# Patient Record
Sex: Male | Born: 1975 | Race: Black or African American | Hispanic: No | Marital: Single | Smoking: Former smoker
Health system: Southern US, Community
[De-identification: ages and names within clinical notes are randomized; demographics above are authoritative.]

## PROBLEM LIST (undated history)

## (undated) ENCOUNTER — Ambulatory Visit (HOSPITAL_COMMUNITY): Admission: EM | Payer: Medicaid Other | Source: Home / Self Care

## (undated) DIAGNOSIS — K602 Anal fissure, unspecified: Secondary | ICD-10-CM

## (undated) DIAGNOSIS — K6289 Other specified diseases of anus and rectum: Secondary | ICD-10-CM

## (undated) DIAGNOSIS — S025XXA Fracture of tooth (traumatic), initial encounter for closed fracture: Secondary | ICD-10-CM

## (undated) DIAGNOSIS — K611 Rectal abscess: Secondary | ICD-10-CM

## (undated) DIAGNOSIS — R21 Rash and other nonspecific skin eruption: Secondary | ICD-10-CM

## (undated) DIAGNOSIS — K625 Hemorrhage of anus and rectum: Secondary | ICD-10-CM

## (undated) HISTORY — DX: Rash and other nonspecific skin eruption: R21

## (undated) HISTORY — DX: Hemorrhage of anus and rectum: K62.5

## (undated) HISTORY — DX: Anal fissure, unspecified: K60.2

## (undated) HISTORY — DX: Other specified diseases of anus and rectum: K62.89

## (undated) HISTORY — DX: Rectal abscess: K61.1

## (undated) HISTORY — DX: Fracture of tooth (traumatic), initial encounter for closed fracture: S02.5XXA

---

## 1998-02-27 ENCOUNTER — Emergency Department (HOSPITAL_COMMUNITY): Admission: EM | Admit: 1998-02-27 | Discharge: 1998-02-27 | Payer: Self-pay | Admitting: Emergency Medicine

## 1998-02-27 ENCOUNTER — Encounter: Payer: Self-pay | Admitting: Emergency Medicine

## 2001-09-30 ENCOUNTER — Emergency Department (HOSPITAL_COMMUNITY): Admission: EM | Admit: 2001-09-30 | Discharge: 2001-09-30 | Payer: Self-pay | Admitting: Emergency Medicine

## 2005-06-26 ENCOUNTER — Ambulatory Visit: Payer: Self-pay | Admitting: Internal Medicine

## 2005-06-30 ENCOUNTER — Ambulatory Visit: Payer: Self-pay | Admitting: Internal Medicine

## 2005-07-01 ENCOUNTER — Ambulatory Visit: Payer: Self-pay | Admitting: *Deleted

## 2006-09-03 ENCOUNTER — Emergency Department (HOSPITAL_COMMUNITY): Admission: EM | Admit: 2006-09-03 | Discharge: 2006-09-03 | Payer: Self-pay | Admitting: Emergency Medicine

## 2007-09-18 ENCOUNTER — Emergency Department (HOSPITAL_COMMUNITY): Admission: EM | Admit: 2007-09-18 | Discharge: 2007-09-18 | Payer: Self-pay | Admitting: Emergency Medicine

## 2007-09-26 ENCOUNTER — Emergency Department (HOSPITAL_COMMUNITY): Admission: EM | Admit: 2007-09-26 | Discharge: 2007-09-26 | Payer: Self-pay | Admitting: Emergency Medicine

## 2007-10-01 ENCOUNTER — Emergency Department (HOSPITAL_COMMUNITY): Admission: EM | Admit: 2007-10-01 | Discharge: 2007-10-01 | Payer: Self-pay | Admitting: Emergency Medicine

## 2008-05-30 ENCOUNTER — Telehealth: Payer: Self-pay | Admitting: Internal Medicine

## 2008-06-01 ENCOUNTER — Ambulatory Visit: Payer: Self-pay | Admitting: Internal Medicine

## 2008-06-01 ENCOUNTER — Encounter: Payer: Self-pay | Admitting: Internal Medicine

## 2008-06-01 DIAGNOSIS — B20 Human immunodeficiency virus [HIV] disease: Secondary | ICD-10-CM

## 2008-06-01 DIAGNOSIS — A539 Syphilis, unspecified: Secondary | ICD-10-CM

## 2008-08-01 ENCOUNTER — Encounter: Payer: Self-pay | Admitting: Internal Medicine

## 2008-08-02 ENCOUNTER — Encounter: Payer: Self-pay | Admitting: Internal Medicine

## 2009-01-04 ENCOUNTER — Encounter: Payer: Self-pay | Admitting: Internal Medicine

## 2009-03-22 ENCOUNTER — Encounter: Payer: Self-pay | Admitting: Internal Medicine

## 2009-03-22 ENCOUNTER — Ambulatory Visit: Payer: Self-pay | Admitting: Internal Medicine

## 2009-03-22 LAB — CONVERTED CEMR LAB
ALT: 20 units/L (ref 0–53)
AST: 28 units/L (ref 0–37)
Absolute CD4: 138 #/uL — ABNORMAL LOW (ref 381–1469)
Albumin: 4.4 g/dL (ref 3.5–5.2)
Alkaline Phosphatase: 88 units/L (ref 39–117)
BUN: 11 mg/dL (ref 6–23)
Basophils Absolute: 0 10*3/uL (ref 0.0–0.1)
Basophils Relative: 0 % (ref 0–1)
CD4 T Helper %: 17 % — ABNORMAL LOW (ref 32–62)
CO2: 23 meq/L (ref 19–32)
Calcium: 8.9 mg/dL (ref 8.4–10.5)
Chlamydia, Swab/Urine, PCR: NEGATIVE
Chloride: 103 meq/L (ref 96–112)
Creatinine, Ser: 1.02 mg/dL (ref 0.40–1.50)
Eosinophils Absolute: 0.3 10*3/uL (ref 0.0–0.7)
Eosinophils Relative: 11 % — ABNORMAL HIGH (ref 0–5)
GC Probe Amp, Urine: NEGATIVE
Glucose, Bld: 76 mg/dL (ref 70–99)
HCT: 39.6 % (ref 39.0–52.0)
HCV Ab: NEGATIVE
HIV 1 RNA Quant: 370000 copies/mL — ABNORMAL HIGH (ref <48)
HIV-1 RNA Quant, Log: 5.57 — ABNORMAL HIGH (ref <1.68)
Hemoglobin: 12.4 g/dL — ABNORMAL LOW (ref 13.0–17.0)
Hep B S Ab: NEGATIVE
Hepatitis B Surface Ag: NEGATIVE
Lymphocytes Relative: 30 % (ref 12–46)
Lymphs Abs: 0.8 10*3/uL (ref 0.7–4.0)
MCHC: 31.3 g/dL (ref 30.0–36.0)
MCV: 87 fL (ref 78.0–100.0)
Monocytes Absolute: 0.4 10*3/uL (ref 0.1–1.0)
Monocytes Relative: 14 % — ABNORMAL HIGH (ref 3–12)
Neutro Abs: 1.2 10*3/uL — ABNORMAL LOW (ref 1.7–7.7)
Neutrophils Relative %: 45 % (ref 43–77)
Platelets: 204 10*3/uL (ref 150–400)
Potassium: 3.6 meq/L (ref 3.5–5.3)
RBC: 4.55 M/uL (ref 4.22–5.81)
RDW: 13.4 % (ref 11.5–15.5)
RPR Ser Ql: REACTIVE — AB
RPR Titer: 1:4 {titer}
Sodium: 139 meq/L (ref 135–145)
T pallidum Antibodies (TP-PA): 32.9 — ABNORMAL HIGH (ref <1.0)
Total Bilirubin: 0.3 mg/dL (ref 0.3–1.2)
Total Protein: 8.4 g/dL — ABNORMAL HIGH (ref 6.0–8.3)
Total lymphocyte count: 810 cells/mcL (ref 700–3300)
WBC: 2.7 10*3/uL — ABNORMAL LOW (ref 4.0–10.5)

## 2009-03-30 ENCOUNTER — Telehealth: Payer: Self-pay | Admitting: Internal Medicine

## 2009-04-12 ENCOUNTER — Encounter: Payer: Self-pay | Admitting: Internal Medicine

## 2009-04-12 ENCOUNTER — Ambulatory Visit: Payer: Self-pay | Admitting: Internal Medicine

## 2009-04-12 LAB — CONVERTED CEMR LAB
HIV 1 RNA Quant: 485000 copies/mL — ABNORMAL HIGH (ref <48)
HIV-1 RNA Quant, Log: 5.69 — ABNORMAL HIGH (ref <1.68)

## 2009-04-21 ENCOUNTER — Emergency Department (HOSPITAL_COMMUNITY): Admission: EM | Admit: 2009-04-21 | Discharge: 2009-04-21 | Payer: Self-pay | Admitting: Emergency Medicine

## 2009-05-10 ENCOUNTER — Encounter: Payer: Self-pay | Admitting: Internal Medicine

## 2009-05-15 ENCOUNTER — Ambulatory Visit: Payer: Self-pay | Admitting: Internal Medicine

## 2009-05-15 LAB — CONVERTED CEMR LAB
ALT: 18 units/L (ref 0–53)
AST: 24 units/L (ref 0–37)
Albumin: 4 g/dL (ref 3.5–5.2)
Alkaline Phosphatase: 93 units/L (ref 39–117)
BUN: 16 mg/dL (ref 6–23)
CO2: 25 meq/L (ref 19–32)
Calcium: 9.1 mg/dL (ref 8.4–10.5)
Chloride: 101 meq/L (ref 96–112)
Creatinine, Ser: 1.14 mg/dL (ref 0.40–1.50)
Glucose, Bld: 94 mg/dL (ref 70–99)
HCT: 39 % (ref 39.0–52.0)
HIV 1 RNA Quant: 421000 copies/mL — ABNORMAL HIGH (ref <48)
HIV-1 RNA Quant, Log: 5.62 — ABNORMAL HIGH (ref <1.68)
Hemoglobin: 12.2 g/dL — ABNORMAL LOW (ref 13.0–17.0)
MCHC: 31.3 g/dL (ref 30.0–36.0)
MCV: 86.7 fL (ref 78.0–100.0)
Platelets: 219 10*3/uL (ref 150–400)
Potassium: 3.9 meq/L (ref 3.5–5.3)
RBC: 4.5 M/uL (ref 4.22–5.81)
RDW: 13.4 % (ref 11.5–15.5)
Sodium: 137 meq/L (ref 135–145)
Total Bilirubin: 0.3 mg/dL (ref 0.3–1.2)
Total Protein: 8.5 g/dL — ABNORMAL HIGH (ref 6.0–8.3)
WBC: 3.5 10*3/uL — ABNORMAL LOW (ref 4.0–10.5)

## 2009-05-25 ENCOUNTER — Ambulatory Visit: Payer: Self-pay | Admitting: Internal Medicine

## 2009-05-25 DIAGNOSIS — K648 Other hemorrhoids: Secondary | ICD-10-CM | POA: Insufficient documentation

## 2009-05-30 ENCOUNTER — Encounter (INDEPENDENT_AMBULATORY_CARE_PROVIDER_SITE_OTHER): Payer: Self-pay | Admitting: *Deleted

## 2009-06-01 ENCOUNTER — Telehealth: Payer: Self-pay | Admitting: Internal Medicine

## 2009-07-04 ENCOUNTER — Telehealth: Payer: Self-pay | Admitting: Internal Medicine

## 2009-08-13 ENCOUNTER — Telehealth: Payer: Self-pay | Admitting: Internal Medicine

## 2009-08-15 ENCOUNTER — Encounter: Payer: Self-pay | Admitting: Internal Medicine

## 2009-09-06 ENCOUNTER — Telehealth (INDEPENDENT_AMBULATORY_CARE_PROVIDER_SITE_OTHER): Payer: Self-pay | Admitting: *Deleted

## 2009-10-09 ENCOUNTER — Telehealth: Payer: Self-pay | Admitting: Internal Medicine

## 2009-10-18 ENCOUNTER — Ambulatory Visit (HOSPITAL_COMMUNITY): Admission: EM | Admit: 2009-10-18 | Discharge: 2009-10-19 | Payer: Self-pay | Admitting: Emergency Medicine

## 2009-10-30 ENCOUNTER — Ambulatory Visit: Payer: Self-pay | Admitting: Internal Medicine

## 2009-10-30 LAB — CONVERTED CEMR LAB
HIV 1 RNA Quant: <20 copies/mL (ref <20)
HIV-1 RNA Quant, Log: <1.3 (ref <1.30)

## 2009-11-01 LAB — CONVERTED CEMR LAB
ALT: 19 units/L (ref 0–53)
AST: 20 units/L (ref 0–37)
Albumin: 4.2 g/dL (ref 3.5–5.2)
Alkaline Phosphatase: 79 units/L (ref 39–117)
BUN: 13 mg/dL (ref 6–23)
Basophils Absolute: 0 10*3/uL (ref 0.0–0.1)
Basophils Relative: 0 % (ref 0–1)
CO2: 26 meq/L (ref 19–32)
Calcium: 9.3 mg/dL (ref 8.4–10.5)
Chloride: 102 meq/L (ref 96–112)
Creatinine, Ser: 1.28 mg/dL (ref 0.40–1.50)
Eosinophils Absolute: 0.3 10*3/uL (ref 0.0–0.7)
Eosinophils Relative: 8 % — ABNORMAL HIGH (ref 0–5)
Glucose, Bld: 87 mg/dL (ref 70–99)
HCT: 39.2 % (ref 39.0–52.0)
Hemoglobin: 12.4 g/dL — ABNORMAL LOW (ref 13.0–17.0)
Lymphocytes Relative: 31 % (ref 12–46)
Lymphs Abs: 1.1 10*3/uL (ref 0.7–4.0)
MCHC: 31.6 g/dL (ref 30.0–36.0)
MCV: 87.5 fL (ref 78.0–100.0)
Monocytes Absolute: 0.5 10*3/uL (ref 0.1–1.0)
Monocytes Relative: 14 % — ABNORMAL HIGH (ref 3–12)
Neutro Abs: 1.7 10*3/uL (ref 1.7–7.7)
Neutrophils Relative %: 48 % (ref 43–77)
Platelets: 326 10*3/uL (ref 150–400)
Potassium: 4 meq/L (ref 3.5–5.3)
RBC: 4.48 M/uL (ref 4.22–5.81)
RDW: 13.3 % (ref 11.5–15.5)
RPR Ser Ql: REACTIVE — AB
RPR Titer: 1:1 {titer}
Sodium: 137 meq/L (ref 135–145)
T pallidum Antibodies (TP-PA): >8 — ABNORMAL HIGH (ref <0.90)
Total Bilirubin: 0.3 mg/dL (ref 0.3–1.2)
Total Protein: 7.7 g/dL (ref 6.0–8.3)
WBC: 3.6 10*3/uL — ABNORMAL LOW (ref 4.0–10.5)

## 2009-12-18 ENCOUNTER — Encounter (INDEPENDENT_AMBULATORY_CARE_PROVIDER_SITE_OTHER): Payer: Self-pay | Admitting: *Deleted

## 2010-02-19 NOTE — Miscellaneous (Signed)
Summary: RW Update  Clinical Lists Changes  Observations: Added new observation of DATE1STVISIT: 05/25/2009 (08/15/2009 12:00)

## 2010-02-19 NOTE — Progress Notes (Signed)
Summary: release of information  Phone Note Outgoing Call   Call placed by: Sharen Heck RN,  March 30, 2009 11:22 AM Call placed to: Patient Summary of Call: ML ON VM to call. Initial call taken by: Sharen Heck RN,  March 30, 2009 11:22 AM    04/03/09 Pt. returned call and advised to sign release of information so records could be obtained per Dr. Philipp Deputy request. Sharen Heck RN

## 2010-02-19 NOTE — Miscellaneous (Signed)
Summary: HIPAA Restrictions  HIPAA Restrictions   Imported By: Florinda Marker 05/15/2009 16:01:50  _____________________________________________________________________  External Attachment:    Type:   Image     Comment:   External Document

## 2010-02-19 NOTE — Progress Notes (Signed)
Summary: [facility]/pt assist meds arrived for May  Phone Note Refill Request      Prescriptions: ATRIPLA 600-200-300 MG TABS (EFAVIRENZ-EMTRICITAB-TENOFOVIR) .hstab  #30 x 0   Entered by:   Paulo Fruit  BS,CPht II,MPH   Authorized by:   Yisroel Ramming MD   Signed by:   Paulo Fruit  BS,CPht II,MPH on 06/01/2009   Method used:   Samples Given   RxID:   [id number] BACTRIM DS 800-160 MG TABS (SULFAMETHOXAZOLE-TRIMETHOPRIM) Take 1 tablet by mouth once a day  #30 x 0   Entered by:   Paulo Fruit  BS,CPht II,MPH   Authorized by:   Yisroel Ramming MD   Signed by:   Paulo Fruit  BS,CPht II,MPH on 06/01/2009   Method used:   Samples Given   RxID:   [id number]  Patient Assist Medication Verification: Medication name: SMZ-TMP DS 800/160mg  RX #  [id number] Tech approval:MLD   Patient Assist Medication Verification: Medication:Atripla Lot# [id number] Exp Date:10 2013 Tech approval:MLD Call placed to patient with message that assistance medications are ready for pick-up. Left message for patient to contact [facility] @ [phone] Paulo Fruit  BS,CPht II,MPH  Jun 01, 2009 10:49 AM

## 2010-02-19 NOTE — Assessment & Plan Note (Signed)
Summary: 042 f/u /tkk   CC:  follow-up visit, lab results, and blood after bowel movements.  History of Present Illness: Christian Reid here to get started on HIV meds. He c/o of some blood when he wipes after a bowel movement.  No pain.  He does strain to to go to the bathroom.  Preventive Screening-Counseling & Management  Alcohol-Tobacco     Alcohol drinks/day: occasional     Smoking Status: quit < 6 months  Caffeine-Diet-Exercise     Caffeine use/day: 1     Does Patient Exercise: no  Safety-Violence-Falls     Seat Belt Use: yes      Sexual History:  n/a.    Comments: Christian Reid given condoms   Updated Prior Medication List: BACTRIM DS 800-160 MG TABS (SULFAMETHOXAZOLE-TRIMETHOPRIM) Take 1 tablet by mouth once a day ATRIPLA 600-200-300 MG TABS (EFAVIRENZ-EMTRICITAB-TENOFOVIR) .hstab ANUSOL-HC 25 MG SUPP (HYDROCORTISONE ACETATE) one suppository per rectum once daily  Current Allergies (reviewed today): No known allergies  Social History: Sexual History:  n/a  Review of Systems  The patient denies anorexia, fever, weight loss, abdominal pain, and melena.    Vital Signs:  Patient profile:   35 year old male Height:      72 inches (182.88 cm) Weight:      205.8 pounds (93.55 kg) BMI:     28.01 Temp:     98.3 degrees F (36.83 degrees C) oral Pulse rate:   85 / minute BP sitting:   135 / 89  (left arm)  Vitals Entered By: Wendall Mola CMA Duncan Dull) (May 25, 2009 3:26 PM) CC: follow-up visit, lab results, blood after bowel movements Is Patient Diabetic? No Pain Assessment Patient in pain? no      Nutritional Status BMI of 25 - 29 = overweight Nutritional Status Detail appetite "good"  Have you ever been in a relationship where you felt threatened, hurt or afraid?No   Does patient need assistance? Functional Status Self care Ambulation Normal   Physical Exam  General:  alert, well-developed, well-nourished, and well-hydrated.   Head:  normocephalic and atraumatic.    Mouth:  pharynx pink and moist.  no thrush  Lungs:  normal breath sounds.      Impression & Recommendations:  Problem # 1:  HIV INFECTION (ICD-042) Discussed treatment options.  Genotype shows no resistance. Will start Atripla.  Potential side effects discussed. Christian Reid will f/u in 6 weeks for repat labs. The following medications were removed from the medication list:    Bactrim Ds 800-160 Mg Tabs (Sulfamethoxazole-trimethoprim) .Marland Kitchen... Take 1 tablet by mouth once a day His updated medication list for this problem includes:    Bactrim Ds 800-160 Mg Tabs (Sulfamethoxazole-trimethoprim) .Marland Kitchen... Take 1 tablet by mouth once a day  Diagnostics Reviewed:  HIV: HIV positive - not AIDS (06/01/2008)   CD4: 120 (05/16/2009)   WBC: 3.5 (05/15/2009)   Hgb: 12.2 (05/15/2009)   HCT: 39.0 (05/15/2009)   Platelets: 219 (05/15/2009) HIV genotype: See Comment (04/12/2009)   HIV-1 RNA: 421000 (05/15/2009)   HBSAg: NEG (03/22/2009)  Orders: Est. Patient Level III (99213)Future Orders: T-CD4SP ([facility]) (CD4SP) ... 07/06/2009 T-HIV Viral Load [id number]) ... 07/06/2009 T-Comprehensive Metabolic Panel [id number]) ... 07/06/2009 T-CBC w/Diff ([id number]) ... 07/06/2009 T-RPR (Syphilis) [phone]) ... 07/06/2009  Problem # 2:  INTERNAL HEMORRHOIDS WITHOUT MENTION COMP (ICD-455.0) increase fluids and fiber in diet anusol hc supp  Medications Added to Medication List This Visit: 1)  Bactrim Ds 800-160 Mg Tabs (Sulfamethoxazole-trimethoprim) .... Take 1 tablet by  mouth once a day 2)  Atripla 600-200-300 Mg Tabs (Efavirenz-emtricitab-tenofovir) .... Marland Kitchenhstab 3)  Anusol-hc 25 Mg Supp (Hydrocortisone acetate) .... One suppository per rectum once daily  Patient Instructions: 1)  Please schedule a follow-up appointment in 8 weeks, 2 weeks after labs.  Prescriptions: ANUSOL-HC 25 MG SUPP (HYDROCORTISONE ACETATE) one suppository per rectum once daily  #10 x 0   Entered and Authorized by:   Yisroel Ramming MD    Signed by:   Yisroel Ramming MD on 05/25/2009   Method used:   Print then Give to Patient   RxID:   [id number] ATRIPLA 600-200-300 MG TABS (EFAVIRENZ-EMTRICITAB-TENOFOVIR) .hstab  #30 x 5   Entered and Authorized by:   Yisroel Ramming MD   Signed by:   Yisroel Ramming MD on 05/25/2009   Method used:   Print then Give to Patient   RxID:   [id number] BACTRIM DS 800-160 MG TABS (SULFAMETHOXAZOLE-TRIMETHOPRIM) Take 1 tablet by mouth once a day  #30 x 5   Entered and Authorized by:   Yisroel Ramming MD   Signed by:   Yisroel Ramming MD on 05/25/2009   Method used:   Print then Give to Patient   RxID:   [id number]

## 2010-02-19 NOTE — Progress Notes (Signed)
Summary: ncadap meds arrived for Sept--left msg for pt to call offic  Phone Note Refill Request      Prescriptions: BACTRIM DS 800-160 MG TABS (SULFAMETHOXAZOLE-TRIMETHOPRIM) Take 1 tablet by mouth once a day  #30 x 0   Entered by:   Paulo Fruit  BS,CPht II,MPH   Authorized by:   Yisroel Ramming MD   Signed by:   Paulo Fruit  BS,CPht II,MPH on 10/09/2009   Method used:   Samples Given   RxID:   [id number] ATRIPLA 600-200-300 MG TABS (EFAVIRENZ-EMTRICITAB-TENOFOVIR) .hstab  #30 x 0   Entered by:   Paulo Fruit  BS,CPht II,MPH   Authorized by:   Yisroel Ramming MD   Signed by:   Paulo Fruit  BS,CPht II,MPH on 10/09/2009   Method used:   Samples Given   RxID:   [id number]  Patient Assist Medication Verification: Medication name: Sulfameth/trimthroprim 800/160mg  RX # [id number] Tech approval:MLD  Patient Assist Medication Verification: Medication name:Atripla RX # [id number] Tech approval:MLD Call placed to patient with message that assistance medications are ready for pick-up. left message for patient to call Evalee Mutton Dupree  BS,CPht II,MPH  October 09, 2009 12:21 PM

## 2010-02-19 NOTE — Miscellaneous (Signed)
Summary: clinical update/ryan white   Clinical Lists Changes  Observations: Added new observation of RWTITLE: B (05/30/2009 15:44) Added new observation of PAYOR: No Insurance (05/30/2009 15:44) Added new observation of AIDSDAP: Yes 2011 (05/30/2009 15:44) Added new observation of INCOMESOURCE: None (05/30/2009 15:44) Added new observation of HOUSEINCOME: 0  (05/30/2009 15:44) Added new observation of #CHILD<18 IN: No  (05/30/2009 15:44) Added new observation of FAMILYSIZE: 1  (05/30/2009 15:44) Added new observation of HOUSING: Stable/permanent  (05/30/2009 15:44) Added new observation of FINASSESSDT: 05/30/2009  (05/30/2009 15:44) Added new observation of MARITAL STAT: Single  (05/30/2009 15:44) Added new observation of RACE: African American  (05/30/2009 15:44) Added new observation of REC_MESSAGE: Yes  (05/30/2009 15:44) Added new observation of RECPHONECALL: Yes  (05/30/2009 15:44) Added new observation of REC_MAIL: Yes  (05/30/2009 15:44) Added new observation of YEARLYEXPEN: 0  (05/30/2009 15:44) Added new observation of HIV RISK BEH: Unknown  (05/30/2009 15:44) Added new observation of LATINO/HISP: No  (05/30/2009 15:44) Added new observation of RW VITAL STA: Active  (05/30/2009 15:44) Added new observation of PATNTCOUNTY: [place]  (05/30/2009 15:44) Added new observation of RWPARTICIP: Yes  (05/30/2009 15:44)     Christian Reid  BS,CPht II,MPH  May 30, 2009 3:50 PM

## 2010-02-19 NOTE — Miscellaneous (Signed)
Summary: Orders Update  Clinical Lists Changes  Orders: Added new Test order of T-CD4SP [facility]) (CD4SP) - Signed Added new Test order of T-CBC No Diff [phone]) - Signed Added new Test order of T-Comprehensive Metabolic Panel [phone]) - Signed Added new Test order of T-HIV Viral Load [id number]) - Signed

## 2010-02-19 NOTE — Progress Notes (Signed)
Summary: ncadap meds arrived for Aug--left msg for pt to call office  Phone Note Refill Request      Prescriptions: ATRIPLA 600-200-300 MG TABS (EFAVIRENZ-EMTRICITAB-TENOFOVIR) .hstab  #30 x 0   Entered by:   Paulo Fruit  BS,CPht II,MPH   Authorized by:   Yisroel Ramming MD   Signed by:   Paulo Fruit  BS,CPht II,MPH on 09/06/2009   Method used:   Samples Given   RxID:   [id number] BACTRIM DS 800-160 MG TABS (SULFAMETHOXAZOLE-TRIMETHOPRIM) Take 1 tablet by mouth once a day  #30 x 0   Entered by:   Paulo Fruit  BS,CPht II,MPH   Authorized by:   Yisroel Ramming MD   Signed by:   Paulo Fruit  BS,CPht II,MPH on 09/06/2009   Method used:   Samples Given   RxID:   [id number]  Patient Assist Medication Verification: Medication name: Atripla RX # [id number] Tech approval:MLD  Patient Assist Medication Verification: Medication name:Sulfameth/trimethoprim 800/160mg  RX # [id number] Tech approval:MLD  Call placed to patient with message that assistance medications are ready for pick-up. Left message for patient to contact office. Paulo Fruit  BS,CPht II,MPH  September 06, 2009 3:56 PM

## 2010-02-19 NOTE — Miscellaneous (Signed)
Summary: Office Visit ([facility])    Vital Signs:  Patient profile:   35 year old male Height:      72 inches Weight:      197 pounds BMI:     26.81 Temp:     98.4 degrees F oral Pulse rate:   82 / minute Pulse rhythm:   regular Resp:     18 per minute BP sitting:   143 / 96  (left arm) Cuff size:   regular  Vitals Entered By: Michelle Nasuti (March 22, 2009 10:12 AM) CC: 05 follow up. pt may need to begin medication therapy Is Patient Diabetic? No Pain Assessment Patient in pain? no       Does patient need assistance? Functional Status Self care Ambulation Normal   CC:  05 follow up. pt may need to begin medication therapy.  History of Present Illness: Pt has been in jail for the las6 months. While there they did do blood work and were going to start him on meds but they were afraid to because he might not be able to get them after he was released. He has been feeling well.  Current Problems (verified): 1)  Family History Diabetes 1st Degree Relative  (ICD-V18.0) 2)  Family History of Cad Male 1st Degree Relative <60  (ICD-V16.49) 3)  Hx of Syphilis  (ICD-097.9) 4)  HIV Infection  (ICD-042)  Allergies: No Known Drug Allergies   Review of Systems  The patient denies anorexia, fever, and weight loss.     Physical Exam  General:  alert, well-developed, well-nourished, and well-hydrated.   Head:  normocephalic and atraumatic.   Mouth:  pharynx pink and moist.  no thrush  Lungs:  normal breath sounds.      Impression & Recommendations:  Problem # 1:  HIV INFECTION (ICD-042) Will obtain labs and have pt return in 2 weeks. Orders: T-CBC w/Diff [phone]) T-CD4SP [facility]) (CD4SP) T-Comprehensive Metabolic Panel [phone]) T-HIV1 Quant rflx Ultra or Genotype (30865-78469) T-Hepatitis B Surface Antigen [phone]) T-Hepatitis B Surface Antibody (44010-27253) T-Hepatitis C Antibody (66440-34742) T-RPR (Syphilis) (59563-87564) T-Chlamydia   Probe, urine (33295-18841) T-GC Probe, urine (66063-01601) Est. Patient Level III (09323)    Patient Instructions: 1)  Please schedule a follow-up appointment in 2 -3 weeks.    Influenza Immunization History:    Influenza # 1:  Historical (11/24/2008)

## 2010-02-19 NOTE — Miscellaneous (Signed)
  Clinical Lists Changes  Observations: Added new observation of YEARAIDSPOS: 2011  (12/18/2009 12:27) Added new observation of HIV STATUS: CDC-defined AIDS  (12/18/2009 12:27)

## 2010-02-19 NOTE — Progress Notes (Signed)
Summary: [facility]/pt assist med arrived for July  Phone Note Refill Request      Prescriptions: ATRIPLA 600-200-300 MG TABS (EFAVIRENZ-EMTRICITAB-TENOFOVIR) .hstab  #30 x 0   Entered by:   Paulo Fruit  BS,CPht II,MPH   Authorized by:   Yisroel Ramming MD   Signed by:   Paulo Fruit  BS,CPht II,MPH on 08/13/2009   Method used:   Samples Given   RxID:   [id number] BACTRIM DS 800-160 MG TABS (SULFAMETHOXAZOLE-TRIMETHOPRIM) Take 1 tablet by mouth once a day  #30 x 0   Entered by:   Paulo Fruit  BS,CPht II,MPH   Authorized by:   Yisroel Ramming MD   Signed by:   Paulo Fruit  BS,CPht II,MPH on 08/13/2009   Method used:   Samples Given   RxID:   [id number]  Patient Assist Medication Verification: Medication name: Atripla  RX # [id number] Tech approval:MLD  Patient Assist Medication Verification: Medication name:sulfameth/trimethoprim 800/160mg  RX # [id number] Tech approval:MLD Left message on patient's voicemail to call Byrd Hesselbach at [phone] Paulo Fruit  BS,CPht II,MPH  August 13, 2009 4:20 PM

## 2010-02-19 NOTE — Progress Notes (Signed)
Summary: [facility]AP/pt assist meds arrived for Jun via [facility] ADAP  Phone Note Refill Request      Prescriptions: ATRIPLA 600-200-300 MG TABS (EFAVIRENZ-EMTRICITAB-TENOFOVIR) .hstab  #30 x 0   Entered by:   Paulo Fruit  BS,CPht II,MPH   Authorized by:   Yisroel Ramming MD   Signed by:   Paulo Fruit  BS,CPht II,MPH on 07/04/2009   Method used:   Samples Given   RxID:   [id number] BACTRIM DS 800-160 MG TABS (SULFAMETHOXAZOLE-TRIMETHOPRIM) Take 1 tablet by mouth once a day  #30 x 0   Entered by:   Paulo Fruit  BS,CPht II,MPH   Authorized by:   Yisroel Ramming MD   Signed by:   Paulo Fruit  BS,CPht II,MPH on 07/04/2009   Method used:   Samples Given   RxID:   [id number]  Patient Assist Medication Verification: Medication name: Atripla RX #  [id number] Tech approval:MLD  Patient Assist Medication Verification: Medication name:Sulfameth/trimethoprim 800/160mg  RX # [id number] Tech approval:MLD Call placed to patient with message that assistance medications are ready for pick-up. Paulo Fruit  BS,CPht II,MPH  July 04, 2009 12:28 PM

## 2010-02-19 NOTE — Miscellaneous (Signed)
Summary: Office Visit ([facility])    Vital Signs:  Patient profile:   35 year old male Weight:      207.1 pounds Temp:     98.1 degrees F oral Pulse (ortho):   73 / minute Pulse rhythm:   regular Resp:     18 per minute BP sitting:   147 / 98  (left arm)  Vitals Entered By: Sharen Heck RN (April 12, 2009 10:14 AM) CC: f/u 05 labs, reports bumps on face that started a few days ago Is Patient Diabetic? No Pain Assessment Patient in pain? no       Does patient need assistance? Functional Status Self care Ambulation Normal   CC:  f/u 05 labs and reports bumps on face that started a few days ago.  History of Present Illness: Pt here for f/u. He c/o several slightly painful pustules on his face.  Preventive Screening-Counseling & Management  Alcohol-Tobacco     Alcohol drinks/day: <1     Smoking Status: quit < 6 months  Caffeine-Diet-Exercise     Caffeine use/day: 1     Does Patient Exercise: no  Current Problems (verified): 1)  Family History Diabetes 1st Degree Relative  (ICD-V18.0) 2)  Family History of Cad Male 1st Degree Relative <60  (ICD-V16.49) 3)  Hx of Syphilis  (ICD-097.9) 4)  HIV Infection  (ICD-042)  Current Medications (verified): 1)  Bactrim Ds 800-160 Mg Tabs (Sulfamethoxazole-Trimethoprim) .... Take 1 Tablet By Mouth Once A Day  Allergies (verified): No Known Drug Allergies   Review of Systems  The patient denies anorexia, fever, and weight loss.     Physical Exam  General:  alert, well-developed, well-nourished, and well-hydrated.   Head:  normocephalic and atraumatic.   Mouth:  pharynx pink and moist.   Lungs:  normal breath sounds.   Skin:  2 pustules on face   Impression & Recommendations:  Problem # 1:  HIV INFECTION (ICD-042) CD4ct 138 and VL 370,000.  Genotype is pending.  Will start PCP prophylaxis with bactrim and refer him to Burna Mortimer to get enrolled in ADAP. Discussed treatment options. If not resistant pt would  like to start Atripla.  He will f/u in 2-3 weeks for his genotype results. His updated medication list for this problem includes:    Bactrim Ds 800-160 Mg Tabs (Sulfamethoxazole-trimethoprim) .Marland Kitchen... Take 1 tablet by mouth once a day  Orders: Est. Patient Level III (62376) T-HIV1 Quant rflx Ultra or Genotype (28315-17616)  Diagnostics Reviewed:  HIV: HIV positive - not AIDS (06/01/2008)   WBC: 2.7 (03/22/2009)   Hgb: 12.4 (03/22/2009)   HCT: 39.6 (03/22/2009)   Platelets: 204 (03/22/2009) HIV-1 RNA: 370000 (03/22/2009)   HBSAg: NEG (03/22/2009)  Problem # 2:  Hx of SYPHILIS (ICD-097.9) will get medical records fro prison to see what his original titer was.  He was given 2.4 million units x 1 in prison in January.  Medications Added to Medication List This Visit: 1)  Bactrim Ds 800-160 Mg Tabs (Sulfamethoxazole-trimethoprim) .... Take 1 tablet by mouth once a day   Patient Instructions: 1)  Please schedule a follow-up appointment in 2 - 3 weeks. Prescriptions: BACTRIM DS 800-160 MG TABS (SULFAMETHOXAZOLE-TRIMETHOPRIM) Take 1 tablet by mouth once a day  #30 x 5   Entered and Authorized by:   Yisroel Ramming MD   Signed by:   Yisroel Ramming MD on 04/12/2009   Method used:   Print then Give to Patient   RxID:   [id number]

## 2010-03-11 ENCOUNTER — Encounter (INDEPENDENT_AMBULATORY_CARE_PROVIDER_SITE_OTHER): Payer: Self-pay | Admitting: *Deleted

## 2010-03-19 NOTE — Miscellaneous (Signed)
  Clinical Lists Changes  Observations: Added new observation of HIV RISK BEH: MSM (03/11/2010 11:55) 

## 2010-03-27 ENCOUNTER — Encounter: Payer: Self-pay | Admitting: Adult Health

## 2010-03-27 ENCOUNTER — Other Ambulatory Visit (INDEPENDENT_AMBULATORY_CARE_PROVIDER_SITE_OTHER): Payer: Self-pay

## 2010-03-27 ENCOUNTER — Other Ambulatory Visit: Payer: Self-pay | Admitting: Adult Health

## 2010-03-27 LAB — CONVERTED CEMR LAB
ALT: 22 units/L (ref 0–53)
AST: 22 units/L (ref 0–37)
Alkaline Phosphatase: 67 units/L (ref 39–117)
BUN: 11 mg/dL (ref 6–23)
Basophils Absolute: 0 10*3/uL (ref 0.0–0.1)
Basophils Relative: 0 % (ref 0–1)
Creatinine, Ser: 0.88 mg/dL (ref 0.40–1.50)
Eosinophils Absolute: 0.1 10*3/uL (ref 0.0–0.7)
Eosinophils Relative: 4 % (ref 0–5)
HCT: 42.6 % (ref 39.0–52.0)
Hemoglobin: 13.4 g/dL (ref 13.0–17.0)
Lymphocytes Relative: 35 % (ref 12–46)
MCHC: 31.5 g/dL (ref 30.0–36.0)
MCV: 88.9 fL (ref 78.0–100.0)
Monocytes Absolute: 0.4 10*3/uL (ref 0.1–1.0)
RDW: 13.6 % (ref 11.5–15.5)

## 2010-03-28 ENCOUNTER — Encounter: Payer: Self-pay | Admitting: Licensed Clinical Social Worker

## 2010-03-28 DIAGNOSIS — B20 Human immunodeficiency virus [HIV] disease: Secondary | ICD-10-CM

## 2010-03-28 LAB — T-HELPER CELL (CD4) - (RCID CLINIC ONLY)
CD4 % Helper T Cell: 21 % — ABNORMAL LOW (ref 33–55)
CD4 T Cell Abs: 240 uL — ABNORMAL LOW (ref 400–2700)

## 2010-04-02 NOTE — Miscellaneous (Addendum)
Summary: Orders Update  Clinical Lists Changes  Orders: Added new Test order of T-CD4SP ([facility]) (CD4SP) - Signed Added new Test order of T-CBC w/Diff (85025-10010) - Signed Added new Test order of T-Comprehensive Metabolic Panel (80053-22900) - Signed Added new Test order of T-HIV Viral Load (87536-83319) - Signed 

## 2010-04-04 ENCOUNTER — Encounter: Payer: Self-pay | Admitting: Adult Health

## 2010-04-04 LAB — ANAEROBIC CULTURE

## 2010-04-04 LAB — T-HELPER CELL (CD4) - (RCID CLINIC ONLY)
CD4 % Helper T Cell: 18 % — ABNORMAL LOW (ref 33–55)
CD4 T Cell Abs: 200 uL — ABNORMAL LOW (ref 400–2700)

## 2010-04-04 LAB — CULTURE, ROUTINE-ABSCESS: Special Requests: POSITIVE

## 2010-04-04 LAB — BASIC METABOLIC PANEL
BUN: 12 mg/dL (ref 6–23)
Calcium: 9.1 mg/dL (ref 8.4–10.5)
Creatinine, Ser: 0.98 mg/dL (ref 0.4–1.5)
GFR calc non Af Amer: >60 mL/min (ref >60)
Glucose, Bld: 86 mg/dL (ref 70–99)

## 2010-04-04 LAB — CBC
MCHC: 33.5 g/dL (ref 30.0–36.0)
Platelets: 288 10*3/uL (ref 150–400)
RDW: 13 % (ref 11.5–15.5)
WBC: 7.5 10*3/uL (ref 4.0–10.5)

## 2010-04-04 LAB — DIFFERENTIAL
Basophils Absolute: 0 10*3/uL (ref 0.0–0.1)
Basophils Relative: 0 % (ref 0–1)
Lymphocytes Relative: 16 % (ref 12–46)
Monocytes Absolute: 1 10*3/uL (ref 0.1–1.0)
Neutro Abs: 4.9 10*3/uL (ref 1.7–7.7)

## 2010-04-09 LAB — T-HELPER CELL (CD4) - (RCID CLINIC ONLY): CD4 % Helper T Cell: 14 % — ABNORMAL LOW (ref 33–55)

## 2010-04-10 ENCOUNTER — Ambulatory Visit: Payer: Self-pay | Admitting: Adult Health

## 2010-04-10 LAB — POCT CARDIAC MARKERS
Myoglobin, poc: 131 ng/mL (ref 12–200)
Troponin i, poc: <0.05 ng/mL (ref 0.00–0.09)

## 2010-04-10 LAB — CBC
HCT: 37 % — ABNORMAL LOW (ref 39.0–52.0)
Hemoglobin: 12.3 g/dL — ABNORMAL LOW (ref 13.0–17.0)
MCV: 85 fL (ref 78.0–100.0)
RDW: 12.8 % (ref 11.5–15.5)

## 2010-04-10 LAB — DIFFERENTIAL
Basophils Absolute: 0 10*3/uL (ref 0.0–0.1)
Eosinophils Relative: 3 % (ref 0–5)
Lymphocytes Relative: 26 % (ref 12–46)
Monocytes Absolute: 0.4 10*3/uL (ref 0.1–1.0)

## 2010-04-10 LAB — POCT I-STAT, CHEM 8
BUN: 6 mg/dL (ref 6–23)
Calcium, Ion: 1.09 mmol/L — ABNORMAL LOW (ref 1.12–1.32)
Creatinine, Ser: 1 mg/dL (ref 0.4–1.5)
TCO2: 25 mmol/L (ref 0–100)

## 2010-04-22 ENCOUNTER — Ambulatory Visit: Payer: Self-pay | Admitting: Adult Health

## 2010-04-30 ENCOUNTER — Ambulatory Visit (INDEPENDENT_AMBULATORY_CARE_PROVIDER_SITE_OTHER): Payer: Self-pay | Admitting: Adult Health

## 2010-04-30 ENCOUNTER — Encounter: Payer: Self-pay | Admitting: Adult Health

## 2010-04-30 VITALS — BP 153/91 | HR 62 | Temp 97.9°F | Ht 71.0 in | Wt 208.0 lb

## 2010-04-30 DIAGNOSIS — B20 Human immunodeficiency virus [HIV] disease: Secondary | ICD-10-CM

## 2010-05-01 NOTE — Progress Notes (Signed)
Subjective:    Patient ID: Christian Reid, male    DOB: 1975/10/01, 35 y.o.   MRN: [id number]  HPI  In for f/u, not seen by provider since October 2011.  Voices no complaints and claims adherence to medications.    Review of Systems  Constitutional: Negative for fever, chills, diaphoresis, activity change, appetite change, fatigue and unexpected weight change.  HENT: Negative for hearing loss, ear pain, nosebleeds, congestion, sore throat, facial swelling, rhinorrhea, sneezing, drooling, mouth sores, trouble swallowing, neck pain, neck stiffness, dental problem, voice change, postnasal drip, sinus pressure, tinnitus and ear discharge.   Eyes: Negative for photophobia, pain, discharge, redness, itching and visual disturbance.  Respiratory: Negative for apnea, cough, choking, chest tightness, shortness of breath, wheezing and stridor.   Cardiovascular: Negative for chest pain, palpitations and leg swelling.  Gastrointestinal: Negative for nausea, vomiting, abdominal pain, diarrhea, constipation, blood in stool, abdominal distention, anal bleeding and rectal pain.  Genitourinary: Negative for dysuria, urgency, frequency, hematuria, flank pain, decreased urine volume, discharge, penile swelling, scrotal swelling, enuresis, difficulty urinating, genital sores, penile pain and testicular pain.  Musculoskeletal: Negative for myalgias, back pain, joint swelling, arthralgias and gait problem.  Skin: Negative for color change, pallor, rash and wound.  Neurological: Negative for dizziness, tremors, seizures, syncope, facial asymmetry, speech difficulty, weakness, light-headedness, numbness and headaches.  Hematological: Negative for adenopathy. Does not bruise/bleed easily.  Psychiatric/Behavioral: Negative for suicidal ideas, hallucinations, behavioral problems, confusion, sleep disturbance, self-injury, dysphoric mood, decreased concentration and agitation. The patient is not nervous/anxious and is not  hyperactive.        Objective:   Physical Exam  Constitutional: He is oriented to person, place, and time. He appears well-developed and well-nourished. No distress.  HENT:  Head: Atraumatic.  Right Ear: External ear normal.  Left Ear: External ear normal.  Nose: Nose normal.  Mouth/Throat: Oropharynx is clear and moist. No oropharyngeal exudate.  Eyes: Conjunctivae and EOM are normal. Pupils are equal, round, and reactive to light. Right eye exhibits no discharge. Left eye exhibits no discharge. No scleral icterus.  Neck: Normal range of motion. Neck supple. No JVD present. No tracheal deviation present. No thyromegaly present.  Cardiovascular: Normal rate, regular rhythm, normal heart sounds and intact distal pulses.  Exam reveals no gallop and no friction rub.   No murmur heard. Pulmonary/Chest: Effort normal and breath sounds normal. No respiratory distress. He has no wheezes. He has no rales. He exhibits no tenderness.  Abdominal: Soft. Bowel sounds are normal. He exhibits no distension and no mass. There is no tenderness. There is no rebound and no guarding.  Musculoskeletal: Normal range of motion. He exhibits no edema and no tenderness.  Lymphadenopathy:    He has no cervical adenopathy.  Neurological: He is alert and oriented to person, place, and time. No cranial nerve deficit. He exhibits normal muscle tone. Coordination normal.  Skin: Skin is warm and dry. No rash noted. He is not diaphoretic. No erythema. No pallor.  Psychiatric: He has a normal mood and affect. His behavior is normal. Judgment and thought content normal.          Assessment & Plan:  HIV:  CD4 240 @ 21% with HIV VL < 20 copies/ml.  Clinically stable.  CD4 sustained >= 200 for > 6 months.  Will d/c TMP-SMX, continue Atripla, have labs repeated in 10 weeks with f/u in 3 months.  Verbally acknowledged his need for adherence to more routine f/u and agrees to keep current plan of  care and f/u.

## 2010-07-25 ENCOUNTER — Other Ambulatory Visit: Payer: Self-pay

## 2010-08-08 ENCOUNTER — Ambulatory Visit: Payer: Self-pay | Admitting: Adult Health

## 2010-08-08 ENCOUNTER — Other Ambulatory Visit (INDEPENDENT_AMBULATORY_CARE_PROVIDER_SITE_OTHER): Payer: Self-pay

## 2010-08-08 DIAGNOSIS — Z113 Encounter for screening for infections with a predominantly sexual mode of transmission: Secondary | ICD-10-CM

## 2010-08-08 DIAGNOSIS — B20 Human immunodeficiency virus [HIV] disease: Secondary | ICD-10-CM

## 2010-08-08 DIAGNOSIS — Z79899 Other long term (current) drug therapy: Secondary | ICD-10-CM

## 2010-08-08 LAB — LIPID PANEL: Cholesterol: 161 mg/dL (ref 0–200)

## 2010-08-09 LAB — CBC WITH DIFFERENTIAL/PLATELET
Basophils Absolute: 0 10*3/uL (ref 0.0–0.1)
Lymphocytes Relative: 30 % (ref 12–46)
Neutro Abs: 1.9 10*3/uL (ref 1.7–7.7)
Platelets: 206 10*3/uL (ref 150–400)
RDW: 13.2 % (ref 11.5–15.5)
WBC: 3.3 10*3/uL — ABNORMAL LOW (ref 4.0–10.5)

## 2010-08-09 LAB — COMPLETE METABOLIC PANEL WITH GFR
ALT: 23 U/L (ref 0–53)
AST: 72 U/L — ABNORMAL HIGH (ref 0–37)
Calcium: 8.9 mg/dL (ref 8.4–10.5)
Chloride: 103 mEq/L (ref 96–112)
Creat: 1 mg/dL (ref 0.50–1.35)
Potassium: 3.6 mEq/L (ref 3.5–5.3)

## 2010-08-09 LAB — T-HELPER CELL (CD4) - (RCID CLINIC ONLY): CD4 % Helper T Cell: 26 % — ABNORMAL LOW (ref 33–55)

## 2010-08-09 LAB — GC/CHLAMYDIA PROBE AMP, URINE: GC Probe Amp, Urine: NEGATIVE

## 2010-08-09 LAB — HIV-1 RNA QUANT-NO REFLEX-BLD
HIV 1 RNA Quant: <20 copies/mL (ref <20)
HIV-1 RNA Quant, Log: <1.3 {Log} (ref <1.30)

## 2010-08-22 ENCOUNTER — Ambulatory Visit (INDEPENDENT_AMBULATORY_CARE_PROVIDER_SITE_OTHER): Payer: Self-pay | Admitting: Adult Health

## 2010-08-22 ENCOUNTER — Encounter: Payer: Self-pay | Admitting: Adult Health

## 2010-08-22 DIAGNOSIS — Z79899 Other long term (current) drug therapy: Secondary | ICD-10-CM

## 2010-08-22 DIAGNOSIS — B20 Human immunodeficiency virus [HIV] disease: Secondary | ICD-10-CM

## 2010-08-23 NOTE — Progress Notes (Signed)
  Subjective:    Patient ID: Christian Reid, male    DOB: 1975/06/25, 35 y.o.   MRN: [id number]  HPI Presents for complete to clinic for followup. Essentially doing well. Endorses adherence to his medications with good tolerance and no complications.   Review of Systems  Constitutional: Negative.   HENT: Negative.   Eyes: Negative.   Respiratory: Negative.   Cardiovascular: Negative.   Gastrointestinal: Negative.   Genitourinary: Negative.   Musculoskeletal: Negative.   Skin: Negative.   Neurological: Negative.   Hematological: Negative.   Psychiatric/Behavioral: Negative.        Objective:   Physical Exam  Constitutional: He is oriented to person, place, and time. He appears well-developed and well-nourished. No distress.  HENT:  Head: Normocephalic and atraumatic.  Right Ear: External ear normal.  Left Ear: External ear normal.  Nose: Nose normal.  Mouth/Throat: Oropharynx is clear and moist.  Eyes: Conjunctivae are normal. Pupils are equal, round, and reactive to light.  Neck: Normal range of motion. Neck supple.  Cardiovascular: Normal rate, regular rhythm and intact distal pulses.   Pulmonary/Chest: Effort normal and breath sounds normal.  Abdominal: Soft. Bowel sounds are normal.  Musculoskeletal: Normal range of motion.  Neurological: He is alert and oriented to person, place, and time. No cranial nerve deficit. He exhibits normal muscle tone. Coordination normal.  Skin: Skin is warm and dry.  Psychiatric: He has a normal mood and affect. His behavior is normal. Judgment and thought content normal.          Assessment & Plan:  1. HIV. Labs obtained 08/08/2010. Show a CD4 count of 300 at 26% with a viral load of less than 20 copies per mL. Clinically stable on current regimen. Recommend continuing present management, returning for followup in 4 months with repeat labs 2 weeks before next appointment.  2. Routine Health Maintenance. On followup, we will perform  anal cytology examination.  Verbally acknowledged all information that was provided to him and agreed with plan of care.

## 2010-10-09 IMAGING — CR DG CHEST 2V
2 series · 2 of 2 positions shown · non-contrast
Comparison: None.

CLINICAL DATA: Palpitations.  Rapid heart rate.

CHEST - 2 VIEW

[w chest pa]
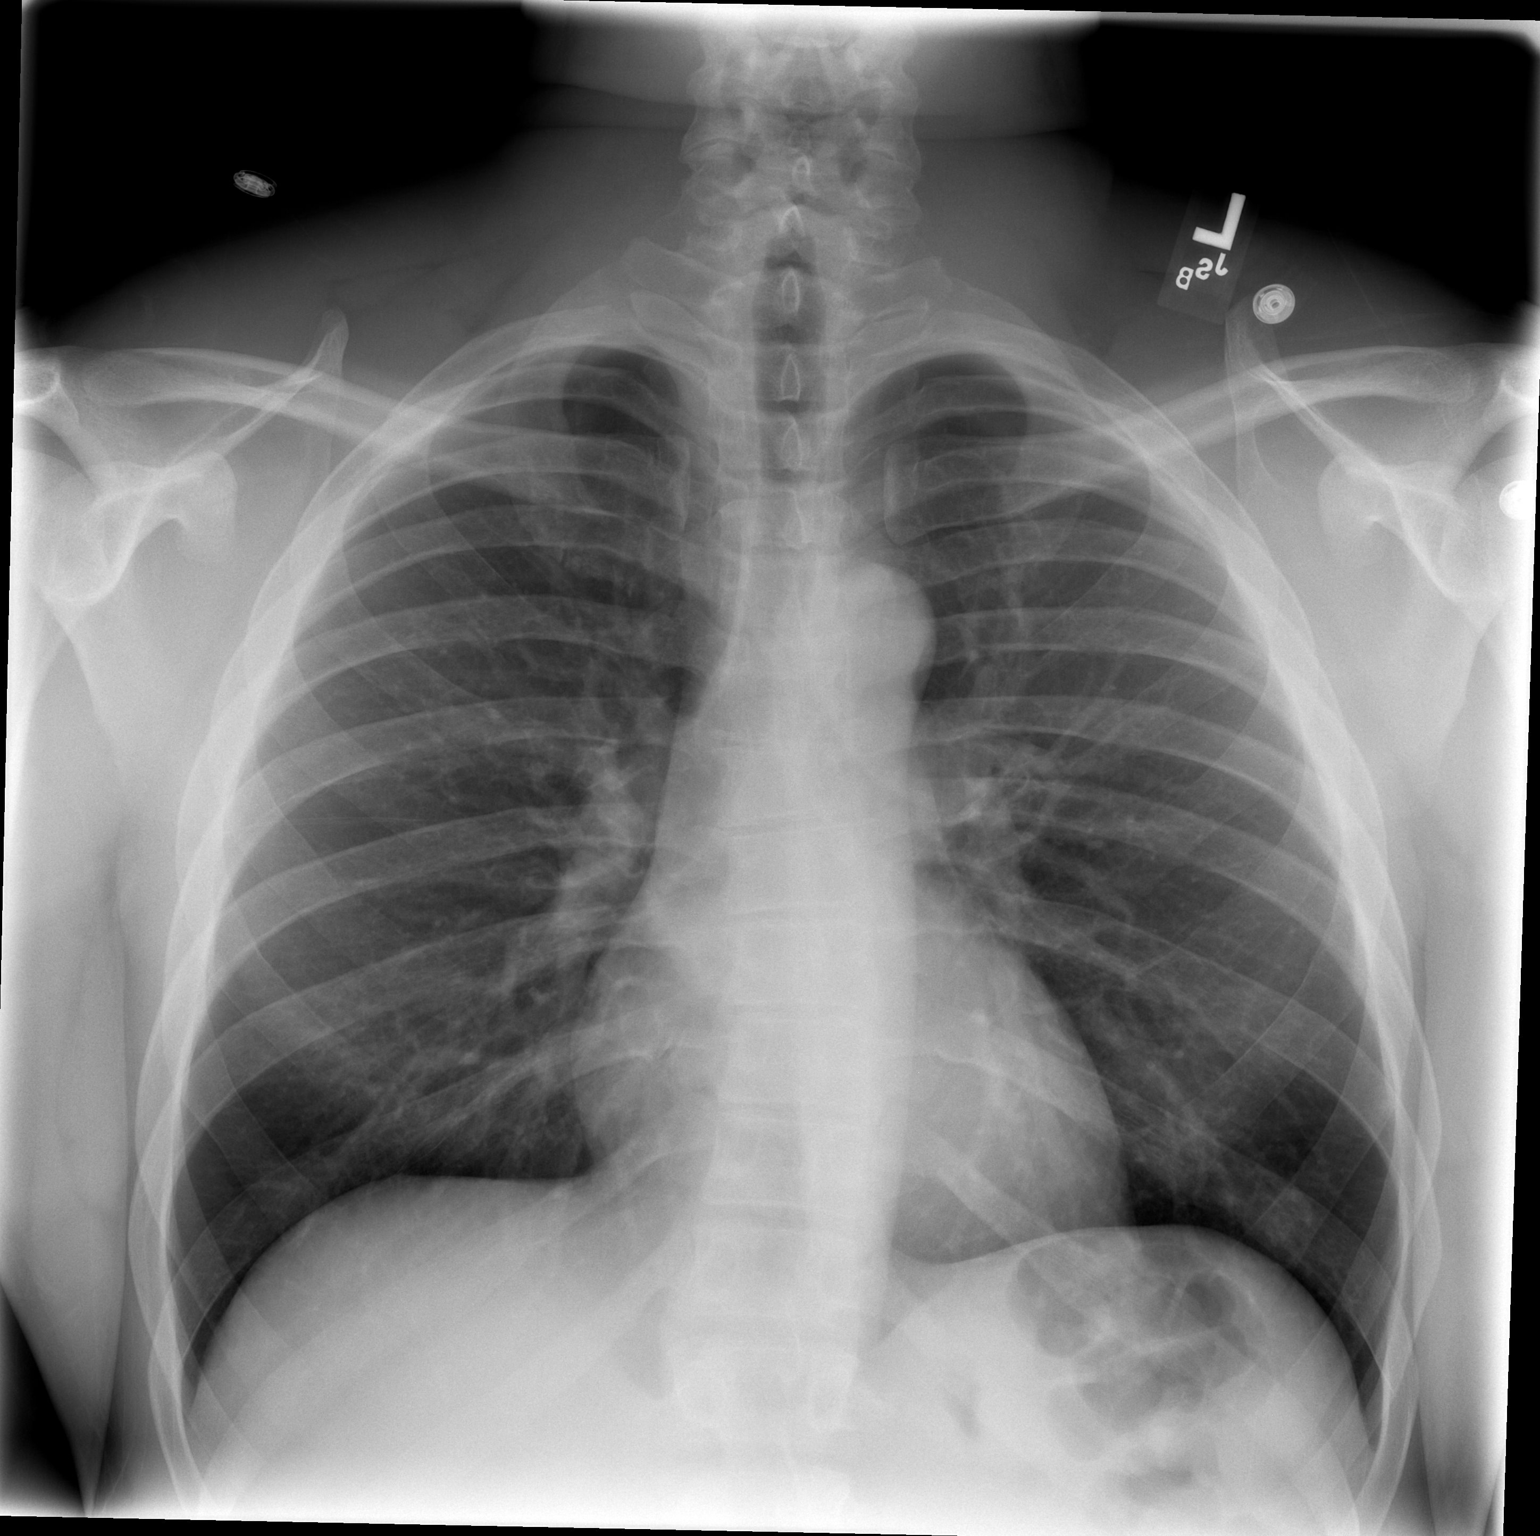

[w chest lat]
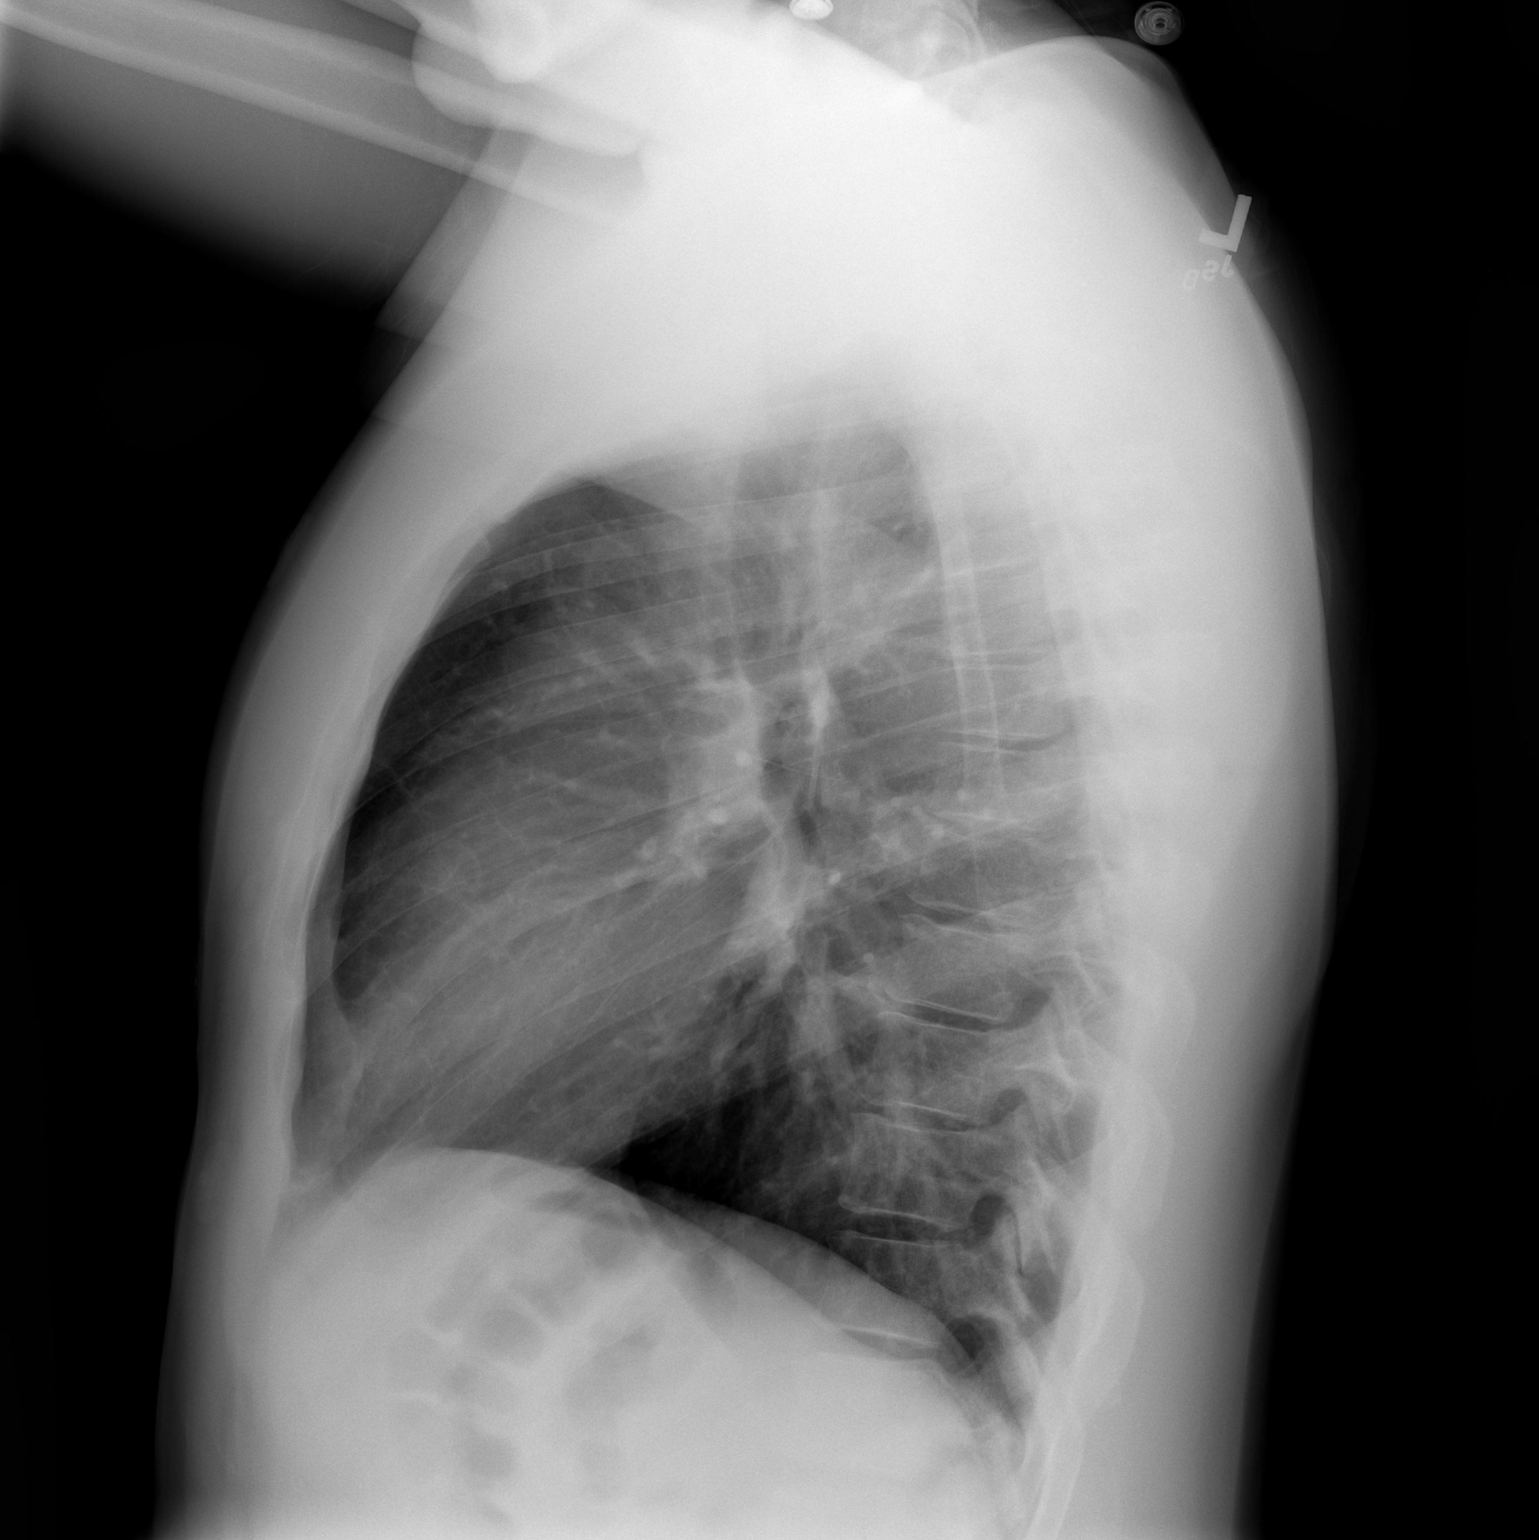

[2 of 2 positions shown; findings below may reference images not displayed]

FINDINGS: Lungs clear.  Cardiopericardial silhouette within normal
limits.  Trachea midline.  No airspace disease or effusion.
IMPRESSION: Normal two-view chest.

## 2010-10-18 ENCOUNTER — Telehealth: Payer: Self-pay

## 2010-10-18 NOTE — Telephone Encounter (Signed)
Tried to reach patient to remind him of renewing ADAP / RW - vm stated phone code incorrect - could not get through.

## 2011-06-24 ENCOUNTER — Telehealth: Payer: Self-pay | Admitting: *Deleted

## 2011-06-24 NOTE — Telephone Encounter (Signed)
I left him a message asking him to call & make an appt

## 2015-08-23 ENCOUNTER — Other Ambulatory Visit: Payer: Self-pay

## 2015-09-06 ENCOUNTER — Ambulatory Visit: Payer: Self-pay | Admitting: Internal Medicine

## 2016-01-18 ENCOUNTER — Encounter: Payer: Self-pay | Admitting: *Deleted

## 2016-02-19 ENCOUNTER — Ambulatory Visit: Payer: Self-pay | Admitting: Internal Medicine

## 2016-03-11 ENCOUNTER — Ambulatory Visit: Payer: Self-pay | Admitting: Internal Medicine

## 2016-03-18 ENCOUNTER — Ambulatory Visit: Payer: Self-pay | Admitting: Infectious Diseases

## 2016-03-19 ENCOUNTER — Other Ambulatory Visit: Payer: Self-pay | Admitting: Internal Medicine

## 2016-03-19 ENCOUNTER — Ambulatory Visit: Payer: Self-pay

## 2016-03-19 ENCOUNTER — Ambulatory Visit: Payer: Self-pay | Admitting: Infectious Diseases

## 2016-03-19 ENCOUNTER — Other Ambulatory Visit (INDEPENDENT_AMBULATORY_CARE_PROVIDER_SITE_OTHER): Payer: Self-pay

## 2016-03-19 DIAGNOSIS — Z113 Encounter for screening for infections with a predominantly sexual mode of transmission: Secondary | ICD-10-CM

## 2016-03-19 DIAGNOSIS — Z79899 Other long term (current) drug therapy: Secondary | ICD-10-CM

## 2016-03-19 DIAGNOSIS — B20 Human immunodeficiency virus [HIV] disease: Secondary | ICD-10-CM

## 2016-03-19 LAB — CBC WITH DIFFERENTIAL/PLATELET
BASOS ABS: 0 {cells}/uL (ref 0–200)
Basophils Relative: 0 %
EOS ABS: 116 {cells}/uL (ref 15–500)
Eosinophils Relative: 4 %
HEMATOCRIT: 44 % (ref 38.5–50.0)
HEMOGLOBIN: 14.2 g/dL (ref 13.2–17.1)
Lymphocytes Relative: 37 %
Lymphs Abs: 1073 cells/uL (ref 850–3900)
MCH: 28.2 pg (ref 27.0–33.0)
MCHC: 32.3 g/dL (ref 32.0–36.0)
MCV: 87.3 fL (ref 80.0–100.0)
MONO ABS: 464 {cells}/uL (ref 200–950)
MPV: 11.7 fL (ref 7.5–12.5)
Monocytes Relative: 16 %
NEUTROS ABS: 1247 {cells}/uL — AB (ref 1500–7800)
NEUTROS PCT: 43 %
Platelets: 196 10*3/uL (ref 140–400)
RBC: 5.04 MIL/uL (ref 4.20–5.80)
RDW: 13.8 % (ref 11.0–15.0)
WBC: 2.9 10*3/uL — ABNORMAL LOW (ref 3.8–10.8)

## 2016-03-20 ENCOUNTER — Encounter: Payer: Self-pay | Admitting: Infectious Disease

## 2016-03-20 LAB — COMPLETE METABOLIC PANEL WITH GFR
ALBUMIN: 4.3 g/dL (ref 3.6–5.1)
ALK PHOS: 58 U/L (ref 40–115)
ALT: 17 U/L (ref 9–46)
AST: 26 U/L (ref 10–40)
BILIRUBIN TOTAL: 0.6 mg/dL (ref 0.2–1.2)
BUN: 10 mg/dL (ref 7–25)
CALCIUM: 9 mg/dL (ref 8.6–10.3)
CO2: 23 mmol/L (ref 20–31)
Chloride: 103 mmol/L (ref 98–110)
Creat: 1.11 mg/dL (ref 0.60–1.35)
GFR, EST NON AFRICAN AMERICAN: 82 mL/min (ref >=60)
GFR, Est African American: >89 mL/min (ref >=60)
Glucose, Bld: 79 mg/dL (ref 65–99)
POTASSIUM: 4.1 mmol/L (ref 3.5–5.3)
Sodium: 139 mmol/L (ref 135–146)
TOTAL PROTEIN: 7.3 g/dL (ref 6.1–8.1)

## 2016-03-20 LAB — HEPATITIS C ANTIBODY: HCV Ab: NEGATIVE

## 2016-03-20 LAB — LIPID PANEL
CHOL/HDL RATIO: 2.5 ratio (ref <5.0)
CHOLESTEROL: 157 mg/dL (ref <200)
HDL: 63 mg/dL (ref >40)
LDL CALC: 83 mg/dL (ref <100)
TRIGLYCERIDES: 54 mg/dL (ref <150)
VLDL: 11 mg/dL (ref <30)

## 2016-03-20 LAB — URINALYSIS
BILIRUBIN URINE: NEGATIVE
GLUCOSE, UA: NEGATIVE
Hgb urine dipstick: NEGATIVE
KETONES UR: NEGATIVE
NITRITE: NEGATIVE
Protein, ur: NEGATIVE
SPECIFIC GRAVITY, URINE: 1.022 (ref 1.001–1.035)
pH: 5.5 (ref 5.0–8.0)

## 2016-03-20 LAB — HEPATITIS B SURFACE ANTIBODY,QUALITATIVE: HEP B S AB: NEGATIVE

## 2016-03-20 LAB — URINE CYTOLOGY ANCILLARY ONLY
CHLAMYDIA, DNA PROBE: POSITIVE — AB
NEISSERIA GONORRHEA: NEGATIVE

## 2016-03-20 LAB — T-HELPER CELL (CD4) - (RCID CLINIC ONLY)
CD4 T CELL ABS: 180 /uL — AB (ref 400–2700)
CD4 T CELL HELPER: 18 % — AB (ref 33–55)

## 2016-03-20 LAB — FLUORESCENT TREPONEMAL AB(FTA)-IGG-BLD: Fluorescent Treponemal ABS: REACTIVE — AB

## 2016-03-20 LAB — RPR: RPR: REACTIVE — AB

## 2016-03-20 LAB — HEPATITIS B SURFACE ANTIGEN: Hepatitis B Surface Ag: NEGATIVE

## 2016-03-20 LAB — HEPATITIS A ANTIBODY, TOTAL: Hep A Total Ab: REACTIVE — AB

## 2016-03-20 LAB — RPR TITER

## 2016-03-20 LAB — HEPATITIS B CORE ANTIBODY, TOTAL: HEP B C TOTAL AB: NONREACTIVE

## 2016-03-21 ENCOUNTER — Telehealth: Payer: Self-pay | Admitting: *Deleted

## 2016-03-21 LAB — HIV-1 RNA,QN PCR W/REFLEX GENOTYPE
HIV-1 RNA, QN PCR: 214000 Copies/mL — ABNORMAL HIGH
HIV-1 RNA, QN PCR: 5.33 Log cps/mL — ABNORMAL HIGH

## 2016-03-21 NOTE — Telephone Encounter (Signed)
-----   Message from Gardiner Barefootobert W Comer, MD sent at 03/21/2016  8:28 AM EST ----- Patient with chlamydia.  Please treat with 1 gram azithromycin.  thanks

## 2016-03-21 NOTE — Telephone Encounter (Signed)
Called patient and scheduled him for nurse visit on 03/25/16. He will also see pharmacy for his lab results and to possibly get an Rx to start meds. He last took Prezcobix and Descovy on 02/19/16 when discharged from prison. His ADAP is pending, it was submitted on 03/19/16. His appointment with Dr. Daiva EvesVan Dam is 05/05/16.

## 2016-03-22 LAB — QUANTIFERON TB GOLD ASSAY (BLOOD)
INTERFERON GAMMA RELEASE ASSAY: NEGATIVE
MITOGEN-NIL SO: 8.91 [IU]/mL
QUANTIFERON TB AG MINUS NIL: 0 [IU]/mL
Quantiferon Nil Value: 0.04 IU/mL

## 2016-03-25 ENCOUNTER — Ambulatory Visit: Payer: Self-pay

## 2016-03-27 LAB — HIV-1 GENOTYPR PLUS

## 2016-03-27 LAB — HLA B*5701: HLA-B 5701 W/RFLX HLA-B HIGH: NEGATIVE

## 2016-04-04 ENCOUNTER — Ambulatory Visit (INDEPENDENT_AMBULATORY_CARE_PROVIDER_SITE_OTHER): Payer: Self-pay | Admitting: Pharmacist Clinician (PhC)/ Clinical Pharmacy Specialist

## 2016-04-04 ENCOUNTER — Ambulatory Visit: Payer: Self-pay

## 2016-04-04 DIAGNOSIS — A749 Chlamydial infection, unspecified: Secondary | ICD-10-CM

## 2016-04-04 MED ORDER — AZITHROMYCIN 250 MG PO TABS
1000.0000 mg | ORAL_TABLET | Freq: Once | ORAL | Status: AC
  Administered 2016-04-04: 1000 mg via ORAL

## 2016-04-04 MED ORDER — AZITHROMYCIN 250 MG PO TABS: 1000.0000 mg | ORAL_TABLET | Freq: Once | ORAL | Status: DC

## 2016-04-04 MED ORDER — EMTRICITABINE-TENOFOVIR AF 200-25 MG PO TABS: 1.0000 | ORAL_TABLET | Freq: Every day | ORAL | 5 refills | Status: DC

## 2016-04-04 MED ORDER — SULFAMETHOXAZOLE-TRIMETHOPRIM 400-80 MG PO TABS: 1.0000 | ORAL_TABLET | Freq: Every day | ORAL | 1 refills | Status: DC

## 2016-04-04 MED ORDER — DARUNAVIR-COBICISTAT 800-150 MG PO TABS: 1.0000 | ORAL_TABLET | Freq: Every day | ORAL | 5 refills | Status: DC

## 2016-04-04 NOTE — Progress Notes (Signed)
HPI: Christian Reid is a 41 y.o. male who is here to re-establish care with [place] for his HIV.   Allergies: No Known Allergies  Vitals:    Past Medical History: No past medical history on file.  Social History: Social History   Social History  . Marital status: Single    Spouse name: N/A  . Number of children: N/A  . Years of education: N/A   Social History Main Topics  . Smoking status: Former Research scientist (life sciences)  . Smokeless tobacco: Never Used  . Alcohol use 1.0 oz/week    2 drink(s) per week  . Drug use: Yes    Frequency: 2.0 times per week    Types: Marijuana  . Sexual activity: Yes    Partners: Male    Birth control/ protection: Condom     Comment: declined condoms   Other Topics Concern  . Not on file   Social History Narrative  . No narrative on file    Previous Regimen: ATP, one other ART?, Prezcobix/Descovy  Current Regimen: Off  Labs: HIV 1 RNA Quant (copies/mL)  Date Value  08/08/2010 <20  03/27/2010 <20 copies/mL  10/30/2009 <20 copies/mL   CD4 T Cell Abs  Date Value  03/19/2016 180 /uL (L)  08/08/2010 300 cmm (L)  03/27/2010 240 cmm (L)   Hep B S Ab (no units)  Date Value  03/19/2016 NEG   Hepatitis B Surface Ag (no units)  Date Value  03/19/2016 NEGATIVE   HCV Ab (no units)  Date Value  03/19/2016 NEGATIVE    CrCl: CrCl cannot be calculated (Unknown ideal weight.).  Lipids:    Component Value Date/Time   CHOL 157 03/19/2016 1517   TRIG 54 03/19/2016 1517   HDL 63 03/19/2016 1517   CHOLHDL 2.5 03/19/2016 1517   VLDL 11 03/19/2016 1517   LDLCALC 83 03/19/2016 1517    Assessment: Christian Reid has not seen [place] since 2012 for this HIV management because he moved to [place] at that time. He moved back here recently. In June of last year, he was arrested and put into to the [facility] and subsequently tx to the prison in [place], [place]. She was released in August. During this time, he was getting meds from the jail with a physician in  [place]. He was started on ATP back in the day before he was transition to something else while in [place] but he doesn't remember which. He was then switched to DRV/r+TRV-->>Prezcobix + Descovy. They gave him some meds after being released from prison so he continued it until late January. He is currently not working and without insurance. He met with Christian Reid at the end of Feb for ADAP. HIV VL was 214k at that time with a CD4 of 180. Genotype showed wild type. His RPR showed a very low titer. However, he tested positive for chlamydia. He was notified of the results. He stated that while he was in [place] during that time, they gave him a dose of Azithromycin 1g x 1. However, he said that he threw up because he was taking it on an empty stomach. His CD4 is <200 so I sent an Rx to [facility] for his Septra because it's on the $4 list. He said that he can afford it.   His ADAP is still pending so I'll submit it his Prez/Desc to [facility] today. Confirmed that it was approved and they will mail it to him. Due to the vomiting issue after his last dose of azithromycin,  I'm going to repeat the dose today, just in case. He has an appt with Dr. Tommy Reid coming up in April.   Recommendations:  Restart Prezcobix/Descovy through [employer] Azithromycin 1g PO x1 today Start Septra SS 1 PO daily from [place] F/u with Dr. Tommy Reid in April  Christian Reid, PharmD, BCPS, AAHIVP, [place] for [facility] 04/04/2016, 1:41 PM

## 2016-04-04 NOTE — Patient Instructions (Signed)
Go to [facility] to pick up your [facility] will mail you your Prezcobix and Descovy F/u with Dr. Daiva EvesVan Dam in April

## 2016-05-05 ENCOUNTER — Encounter: Payer: Self-pay | Admitting: Infectious Disease

## 2016-05-05 ENCOUNTER — Ambulatory Visit (INDEPENDENT_AMBULATORY_CARE_PROVIDER_SITE_OTHER): Payer: Self-pay | Admitting: Infectious Disease

## 2016-05-05 VITALS — BP 137/84 | HR 73 | Temp 98.3°F | Wt 217.0 lb

## 2016-05-05 DIAGNOSIS — Z23 Encounter for immunization: Secondary | ICD-10-CM

## 2016-05-05 DIAGNOSIS — B20 Human immunodeficiency virus [HIV] disease: Secondary | ICD-10-CM

## 2016-05-05 DIAGNOSIS — Z113 Encounter for screening for infections with a predominantly sexual mode of transmission: Secondary | ICD-10-CM

## 2016-05-05 DIAGNOSIS — L708 Other acne: Secondary | ICD-10-CM | POA: Insufficient documentation

## 2016-05-05 DIAGNOSIS — A539 Syphilis, unspecified: Secondary | ICD-10-CM

## 2016-05-05 DIAGNOSIS — R21 Rash and other nonspecific skin eruption: Secondary | ICD-10-CM

## 2016-05-05 HISTORY — DX: Rash and other nonspecific skin eruption: R21

## 2016-05-05 LAB — CBC WITH DIFFERENTIAL/PLATELET
BASOS PCT: 0 %
Basophils Absolute: 0 cells/uL (ref 0–200)
Eosinophils Absolute: 224 cells/uL (ref 15–500)
Eosinophils Relative: 7 %
HCT: 42.3 % (ref 38.5–50.0)
Hemoglobin: 13.8 g/dL (ref 13.2–17.1)
LYMPHS PCT: 41 %
Lymphs Abs: 1312 cells/uL (ref 850–3900)
MCH: 28.4 pg (ref 27.0–33.0)
MCHC: 32.6 g/dL (ref 32.0–36.0)
MCV: 87 fL (ref 80.0–100.0)
MONO ABS: 384 {cells}/uL (ref 200–950)
MPV: 11.5 fL (ref 7.5–12.5)
Monocytes Relative: 12 %
Neutro Abs: 1280 cells/uL — ABNORMAL LOW (ref 1500–7800)
Neutrophils Relative %: 40 %
Platelets: 212 10*3/uL (ref 140–400)
RBC: 4.86 MIL/uL (ref 4.20–5.80)
RDW: 13.1 % (ref 11.0–15.0)
WBC: 3.2 10*3/uL — AB (ref 3.8–10.8)

## 2016-05-05 LAB — COMPLETE METABOLIC PANEL WITH GFR
ALBUMIN: 4 g/dL (ref 3.6–5.1)
ALT: 15 U/L (ref 9–46)
AST: 23 U/L (ref 10–40)
Alkaline Phosphatase: 56 U/L (ref 40–115)
BUN: 13 mg/dL (ref 7–25)
CALCIUM: 9.1 mg/dL (ref 8.6–10.3)
CHLORIDE: 104 mmol/L (ref 98–110)
CO2: 26 mmol/L (ref 20–31)
CREATININE: 0.9 mg/dL (ref 0.60–1.35)
GFR, Est Non African American: >89 mL/min (ref >=60)
Glucose, Bld: 81 mg/dL (ref 65–99)
Potassium: 4 mmol/L (ref 3.5–5.3)
Sodium: 137 mmol/L (ref 135–146)
TOTAL PROTEIN: 7.4 g/dL (ref 6.1–8.1)
Total Bilirubin: 0.4 mg/dL (ref 0.2–1.2)

## 2016-05-05 MED ORDER — TRIAMCINOLONE ACETONIDE 0.5 % EX OINT: 1.0000 "application " | TOPICAL_OINTMENT | Freq: Two times a day (BID) | CUTANEOUS | 2 refills | Status: DC

## 2016-05-05 NOTE — Progress Notes (Signed)
Subjective:    Chief complaint: returing to care for HIV, but later also c/o rash on back and abdomen since being in prison  Patient ID: Christian Reid, male    DOB: 1975/12/05, 41 y.o.   MRN: [id number]  HPI  41 year old [place] man previously taken care of by Traci Sermon and before that [employer]. HEh ad moved to [facility] where he was in care. He was on the run from the law and was then incarcerated in [place] and released from jail in January. Was off meds when we saw him for intake. He was restarted on PREZCOBIX and DEscovy and been on them x 1 month  He has a rash on his back and chest present since he was in prison. Prior hx of syphilis but RPR last month only 1:1  Past Medical History:  Diagnosis Date  . Rash 05/05/2016    No past surgical history on file.  No family history on file.    Social History   Social History  . Marital status: Single    Spouse name: N/A  . Number of children: N/A  . Years of education: N/A   Social History Main Topics  . Smoking status: Former Games developer  . Smokeless tobacco: Never Used  . Alcohol use 1.0 oz/week    2 drink(s) per week  . Drug use: Yes    Frequency: 2.0 times per week    Types: Marijuana  . Sexual activity: Yes    Partners: Male    Birth control/ protection: Condom     Comment: declined condoms   Other Topics Concern  . None   Social History Narrative  . None    No Known Allergies   Current Outpatient Prescriptions:  .  darunavir-cobicistat (PREZCOBIX) 800-150 MG tablet, Take 1 tablet by mouth daily with breakfast. Swallow whole. Do NOT crush, break or chew tablets. Take with food., Disp: 30 tablet, Rfl: 5 .  emtricitabine-tenofovir AF (DESCOVY) 200-25 MG tablet, Take 1 tablet by mouth daily., Disp: 30 tablet, Rfl: 5 .  sulfamethoxazole-trimethoprim (BACTRIM) 400-80 MG tablet, Take 1 tablet by mouth daily., Disp: 30 tablet, Rfl: 1 .  triamcinolone ointment (KENALOG) 0.5 %, Apply 1 application  topically 2 (two) times daily., Disp: 30 g, Rfl: 2  Review of Systems  Constitutional: Negative for chills and fever.  HENT: Negative for congestion and sore throat.   Eyes: Negative for photophobia.  Respiratory: Negative for cough, shortness of breath and wheezing.   Cardiovascular: Negative for chest pain, palpitations and leg swelling.  Gastrointestinal: Negative for abdominal pain, blood in stool, constipation, diarrhea, nausea and vomiting.  Genitourinary: Negative for dysuria, flank pain and hematuria.  Musculoskeletal: Negative for back pain and myalgias.  Skin: Positive for rash.  Neurological: Negative for dizziness, weakness and headaches.  Hematological: Does not bruise/bleed easily.  Psychiatric/Behavioral: Negative for suicidal ideas.       Objective:   Physical Exam  Constitutional: He is oriented to person, place, and time. He appears well-developed and well-nourished. No distress.  HENT:  Head: Normocephalic and atraumatic.  Mouth/Throat: No oropharyngeal exudate.  Eyes: Conjunctivae and EOM are normal. No scleral icterus.  Neck: Normal range of motion. Neck supple.  Cardiovascular: Normal rate and regular rhythm.   Pulmonary/Chest: Effort normal. No respiratory distress. He has no wheezes.  Abdominal: He exhibits no distension.  Musculoskeletal: He exhibits no edema or tenderness.  Neurological: He is alert and oriented to person, place, and time. He exhibits normal  muscle tone. Coordination normal.  Skin: Skin is warm and dry. Rash noted. He is not diaphoretic. No erythema. No pallor.  Psychiatric: He has a normal mood and affect. His behavior is normal. Judgment and thought content normal.   Rash 04/1616:            Assessment & Plan:   HIV disease: check labs today. Continue meds via [employer], ADAP should kick ijn soon, bring back for labs in June see me in July and renew ADAP, vaccine for meningococcus  Rash: try triamcinolone 0.5%, rechecking  RPR   Hx of + STI: check GC, chlamydia from OP, rectum and urine  I spent greater than 60  minutes with the patient including greater than 50% of time in face to face counsel of the patient and in coordination of their care.

## 2016-05-06 LAB — T-HELPER CELL (CD4) - (RCID CLINIC ONLY)
CD4 % Helper T Cell: 17 % — ABNORMAL LOW (ref 33–55)
CD4 T Cell Abs: 210 /uL — ABNORMAL LOW (ref 400–2700)

## 2016-05-06 LAB — FLUORESCENT TREPONEMAL AB(FTA)-IGG-BLD: Fluorescent Treponemal ABS: REACTIVE — AB

## 2016-05-06 LAB — RPR TITER: RPR Titer: 1:1 {titer}

## 2016-05-06 LAB — CYTOLOGY, (ORAL, ANAL, URETHRAL) ANCILLARY ONLY
Chlamydia: NEGATIVE
Chlamydia: NEGATIVE
NEISSERIA GONORRHEA: NEGATIVE
Neisseria Gonorrhea: NEGATIVE

## 2016-05-06 LAB — RPR: RPR Ser Ql: REACTIVE — AB

## 2016-05-06 LAB — URINE CYTOLOGY ANCILLARY ONLY
Chlamydia: NEGATIVE
Neisseria Gonorrhea: NEGATIVE

## 2016-05-06 NOTE — Addendum Note (Signed)
Addended by: Lurlean Leyden on: 05/06/2016 12:33 PM   Modules accepted: Orders

## 2016-05-07 ENCOUNTER — Other Ambulatory Visit: Payer: Self-pay | Admitting: Pharmacist

## 2016-05-07 DIAGNOSIS — B20 Human immunodeficiency virus [HIV] disease: Secondary | ICD-10-CM

## 2016-05-07 LAB — HIV-1 RNA QUANT-NO REFLEX-BLD
HIV 1 RNA Quant: 413 copies/mL — ABNORMAL HIGH
HIV-1 RNA QUANT, LOG: 2.62 {Log_copies}/mL — AB

## 2016-05-07 MED ORDER — DARUNAVIR-COBICISTAT 800-150 MG PO TABS: 1.0000 | ORAL_TABLET | Freq: Every day | ORAL | 5 refills | Status: DC

## 2016-05-07 MED ORDER — SULFAMETHOXAZOLE-TRIMETHOPRIM 400-80 MG PO TABS: 1.0000 | ORAL_TABLET | Freq: Every day | ORAL | 1 refills | Status: DC

## 2016-05-07 MED ORDER — EMTRICITABINE-TENOFOVIR AF 200-25 MG PO TABS: 1.0000 | ORAL_TABLET | Freq: Every day | ORAL | 5 refills | Status: DC

## 2016-05-07 NOTE — Progress Notes (Signed)
Thanks Jackie 

## 2016-06-04 ENCOUNTER — Ambulatory Visit (INDEPENDENT_AMBULATORY_CARE_PROVIDER_SITE_OTHER): Payer: Self-pay | Admitting: Pharmacist

## 2016-06-04 ENCOUNTER — Other Ambulatory Visit: Payer: Self-pay

## 2016-06-04 DIAGNOSIS — B20 Human immunodeficiency virus [HIV] disease: Secondary | ICD-10-CM

## 2016-06-04 NOTE — Progress Notes (Signed)
HPI: Christian Reid is a 41 y.o. male who presents to the [place] clinic for HIV follow-up.   Allergies: No Known Allergies  Past Medical History: Past Medical History:  Diagnosis Date  . Rash 05/05/2016    Social History: Social History   Social History  . Marital status: Single    Spouse name: N/A  . Number of children: N/A  . Years of education: N/A   Social History Main Topics  . Smoking status: Former Research scientist (life sciences)  . Smokeless tobacco: Never Used  . Alcohol use 1.0 oz/week    2 drink(s) per week  . Drug use: Yes    Frequency: 2.0 times per week    Types: Marijuana  . Sexual activity: Yes    Partners: Male    Birth control/ protection: Condom     Comment: declined condoms   Other Topics Concern  . Not on file   Social History Narrative  . No narrative on file    Current Regimen: Prezcobix + Descovy  Labs: HIV 1 RNA Quant (copies/mL)  Date Value  05/05/2016 413 (H)  08/08/2010 <20  03/27/2010 <20 copies/mL   CD4 T Cell Abs  Date Value  05/05/2016 210 /uL (L)  03/19/2016 180 /uL (L)  08/08/2010 300 cmm (L)   Hep B S Ab (no units)  Date Value  03/19/2016 NEG   Hepatitis B Surface Ag (no units)  Date Value  03/19/2016 NEGATIVE   HCV Ab (no units)  Date Value  03/19/2016 NEGATIVE    CrCl: CrCl cannot be calculated (Patient's most recent lab result is older than the maximum 21 days allowed.).  Lipids:    Component Value Date/Time   CHOL 157 03/19/2016 1517   TRIG 54 03/19/2016 1517   HDL 63 03/19/2016 1517   CHOLHDL 2.5 03/19/2016 1517   VLDL 11 03/19/2016 1517   LDLCALC 83 03/19/2016 1517    Assessment: Christian Reid is here today for HIV follow-up, labs, and adherence counseling. He is currently taking Prezcobix + Descovy.  His ADAP is approved and he has been getting his medications from [facility].  He states he isn't having any trouble with the medications.  He does tell me he has missed 1-2 doses in the last month.  I spent some time  stressing to him the importance of compliance and potential for resistance if he skips doses.  He is complaining of an itchy rash on his face (I couldn't appreciate any rash when looking at him). He states the ointment Dr. [name] gave him last time for his back worked.  I will message Dr. [name] and see if he can give him something for his face. He is also complaining of ear wax and headaches.  Gave him a recommendation for an ear wax removal kit OTC. I will get labs today.   Plans: - Continue Prezcobix + Descovy - HIV VL and CD4 today - F/u with Dr. [name] 6/18 at [place]. Danee Soller, PharmD, [place] for Infectious Disease 06/04/2016, 3:09 PM

## 2016-06-05 LAB — T-HELPER CELL (CD4) - (RCID CLINIC ONLY)
CD4 % Helper T Cell: 18 % — ABNORMAL LOW (ref 33–55)
CD4 T CELL ABS: 240 /uL — AB (ref 400–2700)

## 2016-06-06 LAB — HIV-1 RNA QUANT-NO REFLEX-BLD
HIV 1 RNA QUANT: 37 {copies}/mL — AB
HIV-1 RNA Quant, Log: 1.57 Log copies/mL — ABNORMAL HIGH

## 2016-07-07 ENCOUNTER — Ambulatory Visit: Payer: Self-pay | Admitting: Infectious Disease

## 2016-07-14 ENCOUNTER — Ambulatory Visit: Payer: Self-pay | Admitting: Infectious Disease

## 2016-09-23 ENCOUNTER — Ambulatory Visit (INDEPENDENT_AMBULATORY_CARE_PROVIDER_SITE_OTHER): Payer: Self-pay | Admitting: Infectious Disease

## 2016-09-23 ENCOUNTER — Encounter: Payer: Self-pay | Admitting: Infectious Disease

## 2016-09-23 VITALS — BP 115/71 | HR 78 | Temp 97.6°F | Wt 209.0 lb

## 2016-09-23 DIAGNOSIS — K6289 Other specified diseases of anus and rectum: Secondary | ICD-10-CM

## 2016-09-23 DIAGNOSIS — Z113 Encounter for screening for infections with a predominantly sexual mode of transmission: Secondary | ICD-10-CM

## 2016-09-23 DIAGNOSIS — A539 Syphilis, unspecified: Secondary | ICD-10-CM

## 2016-09-23 DIAGNOSIS — L708 Other acne: Secondary | ICD-10-CM

## 2016-09-23 DIAGNOSIS — B20 Human immunodeficiency virus [HIV] disease: Secondary | ICD-10-CM

## 2016-09-23 HISTORY — DX: Other specified diseases of anus and rectum: K62.89

## 2016-09-23 MED ORDER — TRIAMCINOLONE ACETONIDE 0.025 % EX OINT: 1.0000 "application " | TOPICAL_OINTMENT | Freq: Every day | CUTANEOUS | 1 refills | Status: DC

## 2016-09-23 NOTE — Progress Notes (Signed)
Subjective:    Chief complaint: bloody material seen from rectum with receptive anal intercourse on condom and also of facial rash     Patient ID: Christian Reid, male    DOB: [birth year], 41 y.o.   MRN: [id number]  HPI  40 year old [place] man previously taken care of by Traci Sermon and before that [employer]. HEh ad moved to [facility] where he was in care. He was on the run from the law and was then incarcerated in [place] and released from jail in January. Was off meds when we saw him for intake. He was restarted on PREZCOBIX and DEscovy.  He has suppressed his virus nicely though he has not renewed his ADAP which will make need to make sure we due to the day or the next day.  He also is complained of some pain with receptive anal intercourse and some bloody material being seen on the condom with his partner removing his penis from his rectum.      Past Medical History:  Diagnosis Date  . Rash 05/05/2016    No past surgical history on file.  No family history on file.    Social History   Social History  . Marital status: Single    Spouse name: N/A  . Number of children: N/A  . Years of education: N/A   Social History Main Topics  . Smoking status: Former Games developer  . Smokeless tobacco: Never Used  . Alcohol use 1.0 oz/week    2 drink(s) per week  . Drug use: Yes    Frequency: 2.0 times per week    Types: Marijuana  . Sexual activity: Yes    Partners: Male    Birth control/ protection: Condom     Comment: declined condoms   Other Topics Concern  . None   Social History Narrative  . None    No Known Allergies   Current Outpatient Prescriptions:  .  darunavir-cobicistat (PREZCOBIX) 800-150 MG tablet, Take 1 tablet by mouth daily with breakfast., Disp: 30 tablet, Rfl: 5 .  emtricitabine-tenofovir AF (DESCOVY) 200-25 MG tablet, Take 1 tablet by mouth daily., Disp: 30 tablet, Rfl: 5 .  sulfamethoxazole-trimethoprim (BACTRIM) 400-80 MG tablet,  Take 1 tablet by mouth daily., Disp: 30 tablet, Rfl: 1 .  triamcinolone ointment (KENALOG) 0.5 %, Apply 1 application topically 2 (two) times daily., Disp: 30 g, Rfl: 2  Review of Systems  Constitutional: Negative for chills and fever.  HENT: Negative for congestion and sore throat.   Eyes: Negative for photophobia.  Respiratory: Negative for cough, shortness of breath and wheezing.   Cardiovascular: Negative for chest pain, palpitations and leg swelling.  Gastrointestinal: Positive for rectal pain. Negative for abdominal pain, blood in stool, constipation, diarrhea, nausea and vomiting.  Genitourinary: Negative for discharge, dysuria, flank pain, hematuria, penile pain, penile swelling, scrotal swelling and testicular pain.  Musculoskeletal: Negative for back pain and myalgias.  Skin: Positive for rash.  Neurological: Negative for dizziness, weakness and headaches.  Hematological: Does not bruise/bleed easily.  Psychiatric/Behavioral: Negative for suicidal ideas.       Objective:   Physical Exam  Constitutional: He is oriented to person, place, and time. He appears well-developed and well-nourished. No distress.  HENT:  Head: Normocephalic and atraumatic.  Mouth/Throat: No oropharyngeal exudate.  Eyes: Conjunctivae and EOM are normal. No scleral icterus.  Neck: Normal range of motion. Neck supple.  Cardiovascular: Normal rate and regular rhythm.   Pulmonary/Chest: Effort normal. No respiratory distress.  He has no wheezes.  Abdominal: He exhibits no distension.  Musculoskeletal: He exhibits no edema or tenderness.  Neurological: He is alert and oriented to person, place, and time. He exhibits normal muscle tone. Coordination normal.  Skin: Skin is warm and dry. Rash noted. He is not diaphoretic. No erythema. No pallor.  Psychiatric: He has a normal mood and affect. His behavior is normal. Judgment and thought content normal.   Facial rash 09/23/16:             Assessment &  Plan:   HIV disease: check labs today. Continue meds via and needs ADAP  May need to do [facility] assistance program   Rash: try triamcinolone 0.025% on face daily, rechecking RPR   Hx of + STI: check GC, chlamydia from OP, rectum and urine  Rectal pain with intecourse and bloody material on condom: For sexual transmitted infection.  I spent greater than 25  minutes with the patient including greater than 50% of time in face to face counsel  of the patientre his HIV, his rash, his rectal pain,  and in coordination of his care.

## 2016-09-24 LAB — CBC WITH DIFFERENTIAL/PLATELET
BASOS ABS: 0 {cells}/uL (ref 0–200)
Basophils Relative: 0 %
EOS PCT: 3 %
Eosinophils Absolute: 84 cells/uL (ref 15–500)
HEMATOCRIT: 40.3 % (ref 38.5–50.0)
HEMOGLOBIN: 13.1 g/dL — AB (ref 13.2–17.1)
LYMPHS ABS: 952 {cells}/uL (ref 850–3900)
LYMPHS PCT: 34 %
MCH: 28.6 pg (ref 27.0–33.0)
MCHC: 32.5 g/dL (ref 32.0–36.0)
MCV: 88 fL (ref 80.0–100.0)
MPV: 12.2 fL (ref 7.5–12.5)
Monocytes Absolute: 392 cells/uL (ref 200–950)
Monocytes Relative: 14 %
NEUTROS PCT: 49 %
Neutro Abs: 1372 cells/uL — ABNORMAL LOW (ref 1500–7800)
Platelets: 215 10*3/uL (ref 140–400)
RBC: 4.58 MIL/uL (ref 4.20–5.80)
RDW: 13.6 % (ref 11.0–15.0)
WBC: 2.8 10*3/uL — AB (ref 3.8–10.8)

## 2016-09-24 LAB — COMPLETE METABOLIC PANEL WITH GFR
ALBUMIN: 4.3 g/dL (ref 3.6–5.1)
ALK PHOS: 67 U/L (ref 40–115)
ALT: 23 U/L (ref 9–46)
AST: 25 U/L (ref 10–40)
BILIRUBIN TOTAL: 0.5 mg/dL (ref 0.2–1.2)
BUN: 11 mg/dL (ref 7–25)
CO2: 26 mmol/L (ref 20–32)
Calcium: 9 mg/dL (ref 8.6–10.3)
Chloride: 105 mmol/L (ref 98–110)
Creat: 1.05 mg/dL (ref 0.60–1.35)
GFR, Est African American: >89 mL/min (ref >=60)
GFR, Est Non African American: 88 mL/min (ref >=60)
Glucose, Bld: 99 mg/dL (ref 65–99)
POTASSIUM: 3.8 mmol/L (ref 3.5–5.3)
Sodium: 139 mmol/L (ref 135–146)
TOTAL PROTEIN: 6.9 g/dL (ref 6.1–8.1)

## 2016-09-24 LAB — HEPATITIS C ANTIBODY: HCV AB: NONREACTIVE

## 2016-09-24 LAB — HEPATITIS B SURFACE ANTIBODY,QUALITATIVE: Hep B S Ab: NONREACTIVE

## 2016-09-24 LAB — HEPATITIS B SURFACE ANTIGEN: Hepatitis B Surface Ag: NONREACTIVE

## 2016-09-24 LAB — HEPATITIS A ANTIBODY, TOTAL: HEP A TOTAL AB: REACTIVE — AB

## 2016-09-24 LAB — RPR TITER

## 2016-09-24 LAB — RPR: RPR Ser Ql: REACTIVE — AB

## 2016-09-25 LAB — CYTOLOGY, (ORAL, ANAL, URETHRAL) ANCILLARY ONLY
CHLAMYDIA, DNA PROBE: NEGATIVE
Chlamydia: NEGATIVE
Neisseria Gonorrhea: NEGATIVE
Neisseria Gonorrhea: NEGATIVE

## 2016-09-25 LAB — T-HELPER CELL (CD4) - (RCID CLINIC ONLY)
CD4 T CELL ABS: 200 /uL — AB (ref 400–2700)
CD4 T CELL HELPER: 19 % — AB (ref 33–55)

## 2016-09-25 LAB — URINE CYTOLOGY ANCILLARY ONLY
CHLAMYDIA, DNA PROBE: NEGATIVE
Neisseria Gonorrhea: NEGATIVE

## 2016-09-25 LAB — HIV-1 RNA QUANT-NO REFLEX-BLD
HIV 1 RNA Quant: 66 copies/mL — ABNORMAL HIGH
HIV-1 RNA QUANT, LOG: 1.82 {Log_copies}/mL — AB

## 2016-09-25 LAB — RPR: RPR Ser Ql: REACTIVE — AB

## 2016-09-25 LAB — RPR TITER: RPR Titer: 1:2 {titer}

## 2016-09-26 LAB — FLUORESCENT TREPONEMAL AB(FTA)-IGG-BLD
FLUORESCENT TREPONEMAL ABS: REACTIVE — AB
FLUORESCENT TREPONEMAL ABS: REACTIVE — AB

## 2016-09-29 ENCOUNTER — Telehealth: Payer: Self-pay | Admitting: Infectious Disease

## 2016-09-29 NOTE — Telephone Encounter (Signed)
Has he logged in to looka this labs. His virus is reasonablly suppressed with VL of 66

## 2016-09-29 NOTE — Telephone Encounter (Signed)
Please advise on patient's lab results drawn on 9/4. Christian Reid, Christian Reid

## 2016-09-29 NOTE — Telephone Encounter (Signed)
Patient would like someone to call him concerning the results of his last visit.

## 2016-09-30 NOTE — Telephone Encounter (Signed)
Smart move. He might also be able to do Dr. Channing MuttersBarrosos's study [facility] but lets start with Dr. Ninetta LightsHatcher

## 2016-09-30 NOTE — Telephone Encounter (Signed)
Reset patient's mychart password at his request. He is concerned about the small amount of bloody discharge after anal intercourse, but what was glad his swabs were negative for chlamydia/gonorrhea. He is interested in an anal pap.

## 2016-10-22 ENCOUNTER — Other Ambulatory Visit: Payer: Self-pay | Admitting: Pharmacist

## 2016-11-04 ENCOUNTER — Ambulatory Visit: Payer: Self-pay | Admitting: Infectious Disease

## 2016-11-04 ENCOUNTER — Telehealth: Payer: Self-pay

## 2016-11-04 NOTE — Telephone Encounter (Signed)
Called Christian Reid to make an appointment for [facility] on 11/07/16. I was unable to reach the pt or leave a voicemail will try again on a later time.  Lorenso Courier, [place]

## 2016-11-05 ENCOUNTER — Encounter: Payer: Self-pay | Admitting: Infectious Disease

## 2016-11-05 ENCOUNTER — Ambulatory Visit (INDEPENDENT_AMBULATORY_CARE_PROVIDER_SITE_OTHER): Payer: Self-pay | Admitting: Infectious Disease

## 2016-11-05 VITALS — BP 123/86 | HR 71 | Temp 98.5°F | Ht 71.0 in | Wt 208.0 lb

## 2016-11-05 DIAGNOSIS — Z79899 Other long term (current) drug therapy: Secondary | ICD-10-CM

## 2016-11-05 DIAGNOSIS — Z23 Encounter for immunization: Secondary | ICD-10-CM

## 2016-11-05 DIAGNOSIS — L708 Other acne: Secondary | ICD-10-CM

## 2016-11-05 DIAGNOSIS — B20 Human immunodeficiency virus [HIV] disease: Secondary | ICD-10-CM

## 2016-11-05 MED ORDER — EMTRICITABINE-TENOFOVIR AF 200-25 MG PO TABS: 1.0000 | ORAL_TABLET | Freq: Every day | ORAL | 11 refills | Status: DC

## 2016-11-05 MED ORDER — DOXYCYCLINE HYCLATE 100 MG PO TABS: 100.0000 mg | ORAL_TABLET | Freq: Two times a day (BID) | ORAL | 1 refills | Status: DC

## 2016-11-05 MED ORDER — DARUNAVIR-COBICISTAT 800-150 MG PO TABS: 1.0000 | ORAL_TABLET | Freq: Every day | ORAL | 11 refills | Status: DC

## 2016-11-05 NOTE — Progress Notes (Signed)
Subjective:    Chief complaint: facial rash failed to improve on topical steroid     Patient ID: Christian Reid, male    DOB: [birth year], 41 y.o.   MRN: [id number]  HPI  41 year old [place] man previously taken care of by Traci Sermon and before that [employer]. HEh ad moved to [facility] where he was in care. He was on the run from the law and was then incarcerated in [place] and released from jail in January. Was off meds when we saw him for intake. He was restarted on PREZCOBIX and DEscovy.  He has suppressed his virus nicely but is still needing to get ADAP renewed.  His facial rash has failed to improve on steroids.  Lab Results  Component Value Date   HIV1RNAQUANT 66 (H) 09/23/2016   HIV1RNAQUANT 37 (H) 06/04/2016   HIV1RNAQUANT 413 (H) 05/05/2016   Lab Results  Component Value Date   CD4TABS 200 (L) 09/23/2016   CD4TABS 240 (L) 06/04/2016   CD4TABS 210 (L) 05/05/2016      Past Medical History:  Diagnosis Date  . Rash 05/05/2016  . Rectal pain 09/23/2016    No past surgical history on file.  No family history on file.    Social History   Social History  . Marital status: Single    Spouse name: N/A  . Number of children: N/A  . Years of education: N/A   Social History Main Topics  . Smoking status: Former Games developer  . Smokeless tobacco: Never Used  . Alcohol use 1.0 oz/week    2 Standard drinks or equivalent per week     Comment: occasional  . Drug use: Yes    Frequency: 2.0 times per week    Types: Marijuana  . Sexual activity: Yes    Partners: Male    Birth control/ protection: Condom     Comment: declined condoms   Other Topics Concern  . None   Social History Narrative  . None    No Known Allergies   Current Outpatient Prescriptions:  .  darunavir-cobicistat (PREZCOBIX) 800-150 MG tablet, Take 1 tablet by mouth daily with breakfast., Disp: 30 tablet, Rfl: 5 .  emtricitabine-tenofovir AF (DESCOVY) 200-25 MG tablet, Take 1  tablet by mouth daily., Disp: 30 tablet, Rfl: 5 .  triamcinolone (KENALOG) 0.025 % ointment, Apply 1 application topically daily., Disp: 30 g, Rfl: 1  Review of Systems  Constitutional: Negative for chills and fever.  HENT: Negative for congestion and sore throat.   Eyes: Negative for photophobia.  Respiratory: Negative for cough, shortness of breath and wheezing.   Cardiovascular: Negative for chest pain, palpitations and leg swelling.  Gastrointestinal: Positive for rectal pain. Negative for abdominal pain, blood in stool, constipation, diarrhea, nausea and vomiting.  Genitourinary: Negative for discharge, dysuria, flank pain, hematuria, penile pain, penile swelling, scrotal swelling and testicular pain.  Musculoskeletal: Negative for back pain and myalgias.  Skin: Positive for rash.  Neurological: Negative for dizziness, weakness and headaches.  Hematological: Does not bruise/bleed easily.  Psychiatric/Behavioral: Negative for suicidal ideas.       Objective:   Physical Exam  Constitutional: He is oriented to person, place, and time. He appears well-developed and well-nourished. No distress.  HENT:  Head: Normocephalic and atraumatic.  Mouth/Throat: No oropharyngeal exudate.  Eyes: Conjunctivae and EOM are normal. No scleral icterus.  Neck: Normal range of motion. Neck supple.  Cardiovascular: Normal rate and regular rhythm.   Pulmonary/Chest: Effort normal. No respiratory  distress. He has no wheezes.  Abdominal: He exhibits no distension.  Musculoskeletal: He exhibits no edema or tenderness.  Neurological: He is alert and oriented to person, place, and time. He exhibits normal muscle tone. Coordination normal.  Skin: Skin is warm and dry. Rash noted. He is not diaphoretic. No erythema. No pallor.  Psychiatric: He has a normal mood and affect. His behavior is normal. Judgment and thought content normal.   Facial rash 09/23/16:     11/05/16:            Assessment &  Plan:   HIV disease: continue Prezcobix and Descovy, RTC in 3 months  Rash: try doxycycline

## 2016-11-06 ENCOUNTER — Encounter: Payer: Self-pay | Admitting: Infectious Disease

## 2017-01-21 ENCOUNTER — Other Ambulatory Visit: Payer: Self-pay

## 2017-02-09 ENCOUNTER — Telehealth: Payer: Self-pay

## 2017-02-09 ENCOUNTER — Ambulatory Visit: Payer: Self-pay | Admitting: Infectious Disease

## 2017-02-09 NOTE — Telephone Encounter (Signed)
I tried reaching out to Christian Reid to get him an appointment in the upcoming HRA clinic with Dr. Ninetta LightsHatcher. This is my second attempt in reaching out to the pt with no luck. I left a voicemail instructing the patient to call me back to get an appointment. HRA clinic is 02/13/17 30 min appointments.  Christian [place], [place]

## 2017-02-10 ENCOUNTER — Ambulatory Visit (INDEPENDENT_AMBULATORY_CARE_PROVIDER_SITE_OTHER): Payer: Self-pay | Admitting: Infectious Disease

## 2017-02-10 ENCOUNTER — Encounter: Payer: Self-pay | Admitting: Infectious Disease

## 2017-02-10 VITALS — BP 130/90 | HR 68 | Temp 98.3°F | Ht 71.0 in | Wt 205.0 lb

## 2017-02-10 DIAGNOSIS — Z79899 Other long term (current) drug therapy: Secondary | ICD-10-CM

## 2017-02-10 DIAGNOSIS — K648 Other hemorrhoids: Secondary | ICD-10-CM

## 2017-02-10 DIAGNOSIS — Z23 Encounter for immunization: Secondary | ICD-10-CM

## 2017-02-10 DIAGNOSIS — K625 Hemorrhage of anus and rectum: Secondary | ICD-10-CM | POA: Insufficient documentation

## 2017-02-10 DIAGNOSIS — K602 Anal fissure, unspecified: Secondary | ICD-10-CM | POA: Insufficient documentation

## 2017-02-10 DIAGNOSIS — Z113 Encounter for screening for infections with a predominantly sexual mode of transmission: Secondary | ICD-10-CM

## 2017-02-10 DIAGNOSIS — B20 Human immunodeficiency virus [HIV] disease: Secondary | ICD-10-CM

## 2017-02-10 HISTORY — DX: Anal fissure, unspecified: K60.2

## 2017-02-10 HISTORY — DX: Hemorrhage of anus and rectum: K62.5

## 2017-02-10 NOTE — Progress Notes (Signed)
Subjective:    Chief complaint: blood per rectum interimittently     Patient ID: Christian Reid, male    DOB: 10-19-1975, 42 y.o.   MRN: [id number]  HPI  42 year old African [place] man previously taken care of by Traci SermonBrad Farrington and before that [employer] AirlinesKelly Vollmer. [facility] [place] [facility] was in [facility]. He was on the run from the law and was then incarcerated [place] and released from jail in January. Was off meds when we saw him for intake. He was restarted on PREZCOBIX and DEscovy.  He has noticed blood per rectum when defecating at several times on one occasion more significantly than others. No melena. He does not know of any external hemorhoids. He does have an anal fissure that was repaired.   Lab Results  Component Value Date   HIV1RNAQUANT 66 (H) 09/23/2016   HIV1RNAQUANT 37 (H) 06/04/2016   HIV1RNAQUANT 413 (H) 05/05/2016   Lab Results  Component Value Date   CD4TABS 200 (L) 09/23/2016   CD4TABS 240 (L) 06/04/2016   CD4TABS 210 (L) 05/05/2016      Past Medical History:  Diagnosis Date  . Anal fissure 02/10/2017  . Rash 05/05/2016  . Rectal bleeding 02/10/2017  . Rectal pain 09/23/2016    History reviewed. No pertinent surgical history.  History reviewed. No pertinent family history.    Social History   Socioeconomic History  . Marital status: Single    Spouse name: None  . Number of children: None  . Years of education: None  . Highest education level: None  Social Needs  . Financial resource strain: None  . Food insecurity - worry: None  . Food insecurity - inability: None  . Transportation needs - medical: None  . Transportation needs - non-medical: None  Occupational History  . None  Tobacco Use  . Smoking status: Former Games developermoker  . Smokeless tobacco: Never Used  Substance and Sexual Activity  . Alcohol use: Yes    Alcohol/week: 1.0 oz    Types: 2 Standard drinks or equivalent per week    Comment: occasional  . Drug use: Yes   Frequency: 2.0 times per week    Types: Marijuana  . Sexual activity: Yes    Partners: Male    Birth control/protection: Condom    Comment: declined condoms  Other Topics Concern  . None  Social History Narrative  . None    No Known Allergies   Current Outpatient Medications:  .  darunavir-cobicistat (PREZCOBIX) 800-150 MG tablet, Take 1 tablet by mouth daily with breakfast., Disp: 30 tablet, Rfl: 11 .  emtricitabine-tenofovir AF (DESCOVY) 200-25 MG tablet, Take 1 tablet by mouth daily., Disp: 30 tablet, Rfl: 11 .  doxycycline (VIBRA-TABS) 100 MG tablet, Take 1 tablet (100 mg total) by mouth 2 (two) times daily. (Patient not taking: Reported on 02/10/2017), Disp: 60 tablet, Rfl: 1 .  triamcinolone (KENALOG) 0.025 % ointment, Apply 1 application topically daily. (Patient not taking: Reported on 02/10/2017), Disp: 30 g, Rfl: 1  Review of Systems  Constitutional: Negative for chills and fever.  HENT: Negative for congestion and sore throat.   Eyes: Negative for photophobia.  Respiratory: Negative for cough, shortness of breath and wheezing.   Cardiovascular: Negative for chest pain, palpitations and leg swelling.  Gastrointestinal: Positive for blood in stool and rectal pain. Negative for abdominal pain, constipation, diarrhea, nausea and vomiting.  Genitourinary: Negative for discharge, dysuria, flank pain, hematuria, penile pain, penile swelling, scrotal swelling  and testicular pain.  Musculoskeletal: Negative for back pain and myalgias.  Skin: Negative for rash.  Neurological: Negative for dizziness, weakness and headaches.  Hematological: Does not bruise/bleed easily.  Psychiatric/Behavioral: Negative for suicidal ideas.       Objective:   Physical Exam  Constitutional: He is oriented to person, place, and time. He appears well-developed and well-nourished. No distress.  HENT:  Head: Normocephalic and atraumatic.  Mouth/Throat: No oropharyngeal exudate.  Eyes: Conjunctivae  and EOM are normal. No scleral icterus.  Neck: Normal range of motion. Neck supple.  Cardiovascular: Normal rate and regular rhythm.  Pulmonary/Chest: Effort normal. No respiratory distress. He has no wheezes.  Abdominal: He exhibits no distension.  Genitourinary:     Musculoskeletal: He exhibits no edema or tenderness.  Neurological: He is alert and oriented to person, place, and time. He exhibits normal muscle tone. Coordination normal.  Skin: Skin is warm and dry. He is not diaphoretic. No erythema. No pallor.  Psychiatric: He has a normal mood and affect. His behavior is normal. Judgment and thought content normal.  Nursing note and vitals reviewed.      Assessment & Plan:   HIV disease: continue Prezcobix and Descovy and renew ADAP  Blood per rectum and prior rectal pain: I suspect he may have hemorroids but he needs this area visualized. He has history of receptive intercourse and is over 67 and would likely qualify for ANCHOR study and I have given him information on this for [facility]. If seen for the study there I believe they could connect him also to surgery if this is indeed a hemorrhoid. If workup does not reveal a cause clearly he needs a colonscopy

## 2017-02-11 LAB — COMPLETE METABOLIC PANEL WITH GFR
AG RATIO: 1.6 (calc) (ref 1.0–2.5)
ALT: 13 U/L (ref 9–46)
AST: 19 U/L (ref 10–40)
Albumin: 4.5 g/dL (ref 3.6–5.1)
Alkaline phosphatase (APISO): 70 U/L (ref 40–115)
BUN: 12 mg/dL (ref 7–25)
CALCIUM: 9.4 mg/dL (ref 8.6–10.3)
CO2: 27 mmol/L (ref 20–32)
Chloride: 103 mmol/L (ref 98–110)
Creat: 1.12 mg/dL (ref 0.60–1.35)
GFR, EST NON AFRICAN AMERICAN: 81 mL/min/{1.73_m2} (ref >=60)
GFR, Est African American: 93 mL/min/{1.73_m2} (ref >=60)
GLOBULIN: 2.8 g/dL (ref 1.9–3.7)
Glucose, Bld: 78 mg/dL (ref 65–99)
POTASSIUM: 3.8 mmol/L (ref 3.5–5.3)
Sodium: 137 mmol/L (ref 135–146)
Total Bilirubin: 0.5 mg/dL (ref 0.2–1.2)
Total Protein: 7.3 g/dL (ref 6.1–8.1)

## 2017-02-11 LAB — CBC WITH DIFFERENTIAL/PLATELET
BASOS ABS: 19 {cells}/uL (ref 0–200)
Basophils Relative: 0.6 %
EOS ABS: 208 {cells}/uL (ref 15–500)
Eosinophils Relative: 6.7 %
HCT: 43.5 % (ref 38.5–50.0)
Hemoglobin: 14.1 g/dL (ref 13.2–17.1)
Lymphs Abs: 1311 cells/uL (ref 850–3900)
MCH: 27.5 pg (ref 27.0–33.0)
MCHC: 32.4 g/dL (ref 32.0–36.0)
MCV: 84.8 fL (ref 80.0–100.0)
MONOS PCT: 14.7 %
MPV: 11.8 fL (ref 7.5–12.5)
NEUTROS PCT: 35.7 %
Neutro Abs: 1107 cells/uL — ABNORMAL LOW (ref 1500–7800)
Platelets: 222 10*3/uL (ref 140–400)
RBC: 5.13 10*6/uL (ref 4.20–5.80)
RDW: 12.5 % (ref 11.0–15.0)
Total Lymphocyte: 42.3 %
WBC mixed population: 456 cells/uL (ref 200–950)
WBC: 3.1 10*3/uL — ABNORMAL LOW (ref 3.8–10.8)

## 2017-02-11 LAB — FLUORESCENT TREPONEMAL AB(FTA)-IGG-BLD: FLUORESCENT TREPONEMAL ABS: REACTIVE — AB

## 2017-02-11 LAB — LIPID PANEL
CHOL/HDL RATIO: 2.2 (calc) (ref <5.0)
CHOLESTEROL: 165 mg/dL (ref <200)
HDL: 76 mg/dL (ref >40)
LDL Cholesterol (Calc): 75 mg/dL (calc)
Non-HDL Cholesterol (Calc): 89 mg/dL (calc) (ref <130)
Triglycerides: 67 mg/dL (ref <150)

## 2017-02-11 LAB — RPR: RPR Ser Ql: REACTIVE — AB

## 2017-02-11 LAB — RPR TITER

## 2017-02-12 LAB — T-HELPER CELL (CD4) - (RCID CLINIC ONLY)
CD4 T CELL HELPER: 21 % — AB (ref 33–55)
CD4 T Cell Abs: 300 /uL — ABNORMAL LOW (ref 400–2700)

## 2017-02-12 LAB — HIV-1 RNA QUANT-NO REFLEX-BLD
HIV 1 RNA QUANT: 38 {copies}/mL — AB
HIV-1 RNA QUANT, LOG: 1.58 {Log_copies}/mL — AB

## 2017-02-12 LAB — URINE CYTOLOGY ANCILLARY ONLY
Chlamydia: NEGATIVE
Neisseria Gonorrhea: NEGATIVE

## 2017-03-12 ENCOUNTER — Telehealth: Payer: Self-pay

## 2017-03-12 ENCOUNTER — Ambulatory Visit: Payer: Self-pay

## 2017-03-12 NOTE — Telephone Encounter (Signed)
Patient came in today for a nurse visit for an injection, however there was no note or order for an injection to be administered today.  The patient was also unaware of what type of injection he needed to get done. Please advise if there is a injection that you wanted the pt to have done. If so could you please notify triage so we can make another appointment to get the injection you would like done. Christian CourierJose L Yaa Donnellan, [place]

## 2017-03-13 ENCOUNTER — Encounter: Payer: Self-pay | Admitting: Infectious Disease

## 2017-04-16 ENCOUNTER — Ambulatory Visit: Payer: Self-pay | Admitting: Infectious Disease

## 2017-04-22 ENCOUNTER — Telehealth: Payer: Self-pay | Admitting: Behavioral Health

## 2017-04-22 NOTE — Telephone Encounter (Signed)
Patient states he is having rectal bleeding and drainage and was requesting to see Dr. Daiva EvesVan Dam.  Scheduled him an appointment with Rexene AlbertsStephanie Dixon NP.  Patient verbalized understanding. Angeline SlimAshley Mohammed Mcandrew RN

## 2017-04-23 ENCOUNTER — Ambulatory Visit: Payer: Self-pay | Admitting: Infectious Diseases

## 2017-05-07 ENCOUNTER — Emergency Department (HOSPITAL_COMMUNITY)
Admission: EM | Admit: 2017-05-07 | Discharge: 2017-05-07 | Disposition: A | Discharge status: Home / Self Care | Payer: Self-pay | Attending: Emergency Medicine | Admitting: Emergency Medicine

## 2017-05-07 ENCOUNTER — Other Ambulatory Visit: Payer: Self-pay

## 2017-05-07 ENCOUNTER — Encounter (HOSPITAL_COMMUNITY): Payer: Self-pay | Admitting: *Deleted

## 2017-05-07 DIAGNOSIS — B20 Human immunodeficiency virus [HIV] disease: Secondary | ICD-10-CM | POA: Insufficient documentation

## 2017-05-07 DIAGNOSIS — K6289 Other specified diseases of anus and rectum: Secondary | ICD-10-CM | POA: Insufficient documentation

## 2017-05-07 DIAGNOSIS — Z79899 Other long term (current) drug therapy: Secondary | ICD-10-CM | POA: Insufficient documentation

## 2017-05-07 DIAGNOSIS — Z87891 Personal history of nicotine dependence: Secondary | ICD-10-CM | POA: Insufficient documentation

## 2017-05-07 LAB — COMPREHENSIVE METABOLIC PANEL
ALT: 17 U/L (ref 17–63)
ANION GAP: 7 (ref 5–15)
AST: 23 U/L (ref 15–41)
Albumin: 4.2 g/dL (ref 3.5–5.0)
Alkaline Phosphatase: 65 U/L (ref 38–126)
BUN: 11 mg/dL (ref 6–20)
CO2: 26 mmol/L (ref 22–32)
Calcium: 9.3 mg/dL (ref 8.9–10.3)
Chloride: 104 mmol/L (ref 101–111)
Creatinine, Ser: 1.04 mg/dL (ref 0.61–1.24)
GFR calc non Af Amer: >60 mL/min (ref >60)
Glucose, Bld: 91 mg/dL (ref 65–99)
Potassium: 3.8 mmol/L (ref 3.5–5.1)
SODIUM: 137 mmol/L (ref 135–145)
Total Bilirubin: 0.8 mg/dL (ref 0.3–1.2)
Total Protein: 7.5 g/dL (ref 6.5–8.1)

## 2017-05-07 LAB — CBC
HCT: 42 % (ref 39.0–52.0)
HEMOGLOBIN: 13.8 g/dL (ref 13.0–17.0)
MCH: 29.4 pg (ref 26.0–34.0)
MCHC: 32.9 g/dL (ref 30.0–36.0)
MCV: 89.6 fL (ref 78.0–100.0)
Platelets: 234 10*3/uL (ref 150–400)
RBC: 4.69 MIL/uL (ref 4.22–5.81)
RDW: 13.4 % (ref 11.5–15.5)
WBC: 3.7 10*3/uL — ABNORMAL LOW (ref 4.0–10.5)

## 2017-05-07 LAB — POC OCCULT BLOOD, ED: FECAL OCCULT BLD: NEGATIVE

## 2017-05-07 MED ORDER — DOXYCYCLINE HYCLATE 100 MG PO CAPS
100.0000 mg | ORAL_CAPSULE | Freq: Two times a day (BID) | ORAL | 0 refills | Status: DC
Start: 2017-05-07 — End: 2017-05-19

## 2017-05-07 MED ORDER — STERILE WATER FOR INJECTION IJ SOLN
INTRAMUSCULAR | Status: AC
  Filled 2017-05-07: qty 10

## 2017-05-07 MED ORDER — DOXYCYCLINE HYCLATE 100 MG PO TABS
100.0000 mg | ORAL_TABLET | Freq: Once | ORAL | Status: AC
  Administered 2017-05-07: 100 mg via ORAL
  Filled 2017-05-07: qty 1

## 2017-05-07 MED ORDER — CEFTRIAXONE SODIUM 250 MG IJ SOLR
250.0000 mg | Freq: Once | INTRAMUSCULAR | Status: AC
  Administered 2017-05-07: 250 mg via INTRAMUSCULAR
  Filled 2017-05-07: qty 250

## 2017-05-07 NOTE — Discharge Instructions (Signed)
Please read and follow all provided instructions.  Your diagnoses today include:  1. Proctitis     Tests performed today include:  Blood counts and electrolytes - normal  Vital signs. See below for your results today.   Medications prescribed:   Doxycycline - antibiotic  You have been prescribed an antibiotic medicine: take the entire course of medicine even if you are feeling better. Stopping early can cause the antibiotic not to work.  Take any prescribed medications only as directed.  Home care instructions:  Follow any educational materials contained in this packet.  BE VERY CAREFUL not to take multiple medicines containing Tylenol (also called acetaminophen). Doing so can lead to an overdose which can damage your liver and cause liver failure and possibly death.   Follow-up instructions: Please follow-up with your infectious disease doctor in the gastroenterology referral for further evaluation and management.  Return instructions:   Please return to the Emergency Department if you experience worsening symptoms.   Please return if you have any other emergent concerns.  Additional Information:  Your vital signs today were: BP 114/81 (BP Location: Left Arm)    Pulse 61    Temp 98.4 F (36.9 C) (Oral)    Resp 16    SpO2 100%  If your blood pressure (BP) was elevated above 135/85 this visit, please have this repeated by your doctor within one month. --------------

## 2017-05-07 NOTE — ED Notes (Signed)
Pt is on probation and says he needs to be home by 2130 hours.

## 2017-05-07 NOTE — ED Notes (Signed)
Pt verbalized understanding of all d/c instructions, prescriptions, and f/u information. Opportunity for questioning and answers provided. VSS. All belongings with patient at this time. Pt ambulatory to lobby with steady gait.   

## 2017-05-07 NOTE — ED Triage Notes (Signed)
Pt reports having consistent rectal bleeding x 1 month. Has bright red blood in toilet when having bowel movement. Hx of fistula in past.

## 2017-05-07 NOTE — ED Provider Notes (Signed)
[facility] EMERGENCY DEPARTMENT Provider Note   CSN: [id number] Arrival date & time: 05/07/17  1053     History   Chief Complaint Chief Complaint  Patient presents with  . Rectal Bleeding    HPI Christian Reid is a 42 y.o. male.  Patient with history of fistula repaired in 2006, HIV last CD4 301/2019 --presents with gradually worsening painless bright red blood per rectum with possible scant discharge over the past 1 week.  Patient has had more minor episodes in the past which he noted on the toilet paper.  Over the past week the volume of blood has been higher and he has noted it in the water.  He has not had any pain.  No fevers or vomiting.  He has not felt any bulging in the area of the rectum.  No urinary symptoms. The onset of this condition was acute. Aggravating factors: none. Alleviating factors: none.       Past Medical History:  Diagnosis Date  . Anal fissure 02/10/2017  . Rash 05/05/2016  . Rectal bleeding 02/10/2017  . Rectal pain 09/23/2016    Patient Active Problem List   Diagnosis Date Noted  . Rectal bleeding 02/10/2017  . Anal fissure 02/10/2017  . Rectal pain 09/23/2016  . Acneiform dermatitis 05/05/2016  . Internal hemorrhoids 05/25/2009  . HIV disease (HCC) 06/01/2008  . SYPHILIS 06/01/2008    History reviewed. No pertinent surgical history.      Home Medications    Prior to Admission medications   Medication Sig Start Date End Date Taking? Authorizing Provider  darunavir-cobicistat (PREZCOBIX) 800-150 MG tablet Take 1 tablet by mouth daily with breakfast. 11/05/16   Daiva Eves, Lisette Grinder, MD  doxycycline (VIBRA-TABS) 100 MG tablet Take 1 tablet (100 mg total) by mouth 2 (two) times daily. Patient not taking: Reported on 02/10/2017 11/05/16   Daiva Eves, Lisette Grinder, MD  emtricitabine-tenofovir AF (DESCOVY) 200-25 MG tablet Take 1 tablet by mouth daily. 11/05/16   Randall Hiss, MD  triamcinolone (KENALOG) 0.025 % ointment  Apply 1 application topically daily. Patient not taking: Reported on 02/10/2017 09/23/16   Daiva Eves, Lisette Grinder, MD    Family History History reviewed. No pertinent family history.  Social History Social History   Tobacco Use  . Smoking status: Former Games developer  . Smokeless tobacco: Never Used  Substance Use Topics  . Alcohol use: Yes    Alcohol/week: 1.0 oz    Types: 2 Standard drinks or equivalent per week    Comment: occasional  . Drug use: Yes    Frequency: 2.0 times per week    Types: Marijuana     Allergies   Patient has no known allergies.   Review of Systems Review of Systems  Constitutional: Negative for fever.  HENT: Negative for rhinorrhea and sore throat.   Eyes: Negative for redness.  Respiratory: Negative for cough.   Cardiovascular: Negative for chest pain.  Gastrointestinal: Positive for blood in stool. Negative for abdominal pain, diarrhea, nausea and vomiting.  Genitourinary: Negative for dysuria.  Musculoskeletal: Negative for myalgias.  Skin: Negative for rash.  Neurological: Negative for headaches.     Physical Exam Updated Vital Signs BP 114/81 (BP Location: Left Arm)   Pulse 61   Temp 98.4 F (36.9 C) (Oral)   Resp 16   SpO2 100%   Physical Exam  Constitutional: He appears well-developed and well-nourished.  HENT:  Head: Normocephalic and atraumatic.  Eyes: Conjunctivae are normal. Right eye  exhibits no discharge. Left eye exhibits no discharge.  Neck: Normal range of motion. Neck supple.  Cardiovascular: Normal rate, regular rhythm and normal heart sounds.  Pulmonary/Chest: Effort normal and breath sounds normal.  Abdominal: Soft. There is no tenderness. There is no rebound and no guarding.  Genitourinary: Rectal exam shows internal hemorrhoid (small, non-thrombosed) and tenderness. Rectal exam shows no external hemorrhoid, no fissure, no mass and guaiac negative stool.  Genitourinary Comments: Question scant yellow discharge, minimal    Neurological: He is alert.  Skin: Skin is warm and dry.  Psychiatric: He has a normal mood and affect.  Nursing note and vitals reviewed.    ED Treatments / Results  Labs (all labs ordered are listed, but only abnormal results are displayed) Labs Reviewed  CBC - Abnormal; Notable for the following components:      Result Value   WBC 3.7 (*)    All other components within normal limits  COMPREHENSIVE METABOLIC PANEL  POC OCCULT BLOOD, ED  GC/CHLAMYDIA PROBE AMP () NOT AT [facility]    EKG None  Radiology No results found.  Procedures Procedures (including critical care time)  Medications Ordered in ED Medications  cefTRIAXone (ROCEPHIN) injection 250 mg (has no administration in time range)  doxycycline (VIBRA-TABS) tablet 100 mg (has no administration in time range)     Initial Impression / Assessment and Plan / ED Course  I have reviewed the triage vital signs and the nursing notes.  Pertinent labs & imaging results that were available during my care of the patient were reviewed by me and considered in my medical decision making (see chart for details).     Patient seen and examined.  Reviewed lab work with patient.  Rectal exam performed with RN chaperone.  Vital signs reviewed and are as follows: BP 114/81 (BP Location: Left Arm)   Pulse 61   Temp 98.4 F (36.9 C) (Oral)   Resp 16   SpO2 100%   7:22 PM Obtained swab for rectal GC/chlamydia from lab.  This was performed with RN chaperone.  Will treat empirically with Rocephin and doxycycline.  Patient will be referred back to infectious disease as well as GI for further evaluation and management.  Encouraged return with worsening bleeding, fever, pain, new symptoms or other concerns.  Patient verbalizes understanding and agrees with plan.  Final Clinical Impressions(s) / ED Diagnoses   Final diagnoses:  Proctitis   Patient with rectal bleeding and possible discharge. Patient does have  tenderness on exam.  Concern for proctitis.  Less likely fissure or hemorrhoidal bleeding.  Will cover for GC chlamydia, swab sent.  Will refer back to ID and GI for further testing and treatment.  I do not feel the patient has life-threatening GI bleed today.  Hemoglobin is normal patient is asymptomatic.  No large bleed noted on exam.  No abdominal pain.  ED Discharge Orders        Ordered    doxycycline (VIBRAMYCIN) 100 MG capsule  2 times daily     05/07/17 1918       Renne CriglerGeiple, Chelisa Hennen, Cordelia Poche-C 05/07/17 1926    Raeford RazorKohut, Stephen, MD 05/09/17 1012

## 2017-05-08 LAB — GC/CHLAMYDIA PROBE AMP (~~LOC~~) NOT AT ARMC
CHLAMYDIA, DNA PROBE: NEGATIVE
NEISSERIA GONORRHEA: NEGATIVE

## 2017-05-14 ENCOUNTER — Ambulatory Visit: Payer: Self-pay | Admitting: Family

## 2017-05-14 ENCOUNTER — Encounter: Payer: Self-pay | Admitting: Gastroenterology

## 2017-05-14 ENCOUNTER — Other Ambulatory Visit: Payer: Self-pay | Admitting: Infectious Disease

## 2017-05-14 ENCOUNTER — Telehealth: Payer: Self-pay | Admitting: *Deleted

## 2017-05-14 DIAGNOSIS — B20 Human immunodeficiency virus [HIV] disease: Secondary | ICD-10-CM

## 2017-05-14 NOTE — Telephone Encounter (Signed)
Patient came to clinic to see if Dr Daiva EvesVan Dam could give him a Rx for Bactrim as he is having pain and drainage from an previous healed wound on his buttocks. Advised he went to the ED for this and they gave him doxy but it does not seem to be working and he has pain. Advised him he will need to be seen as we can not give antibiotic without knowing what we are treating. And Dr Daiva EvesVan Dam does not have anything until next week 05/20/17. Offered the patient an appointment with NP and was able to get him in today 05/14/17 he agreed to take the appt.

## 2017-05-14 NOTE — ED Notes (Signed)
05/14/2017,  Pt. Came to [facility] and asked if we could resend his RX into [facility] at [address],  Pt. Reports that he left his discharge papers on the bus.  Spoke with Renne CriglerJoshua Geiple , PA-C and we called in RX for Doxycycline 100 mg po BID x 7 days , 0 refills into the pharmacy.     Discharge papers printed also and given to pt.  All questions answered.

## 2017-05-19 ENCOUNTER — Encounter (HOSPITAL_COMMUNITY): Payer: Self-pay | Admitting: Emergency Medicine

## 2017-05-19 ENCOUNTER — Emergency Department (HOSPITAL_COMMUNITY)
Admission: EM | Admit: 2017-05-19 | Discharge: 2017-05-19 | Disposition: A | Discharge status: Home / Self Care | Payer: Self-pay | Attending: Emergency Medicine | Admitting: Emergency Medicine

## 2017-05-19 DIAGNOSIS — Z79899 Other long term (current) drug therapy: Secondary | ICD-10-CM | POA: Insufficient documentation

## 2017-05-19 DIAGNOSIS — K047 Periapical abscess without sinus: Secondary | ICD-10-CM | POA: Insufficient documentation

## 2017-05-19 DIAGNOSIS — K029 Dental caries, unspecified: Secondary | ICD-10-CM | POA: Insufficient documentation

## 2017-05-19 DIAGNOSIS — Z87891 Personal history of nicotine dependence: Secondary | ICD-10-CM | POA: Insufficient documentation

## 2017-05-19 DIAGNOSIS — S025XXA Fracture of tooth (traumatic), initial encounter for closed fracture: Secondary | ICD-10-CM | POA: Insufficient documentation

## 2017-05-19 DIAGNOSIS — Y999 Unspecified external cause status: Secondary | ICD-10-CM | POA: Insufficient documentation

## 2017-05-19 DIAGNOSIS — W228XXA Striking against or struck by other objects, initial encounter: Secondary | ICD-10-CM | POA: Insufficient documentation

## 2017-05-19 DIAGNOSIS — Y929 Unspecified place or not applicable: Secondary | ICD-10-CM | POA: Insufficient documentation

## 2017-05-19 DIAGNOSIS — Y9389 Activity, other specified: Secondary | ICD-10-CM | POA: Insufficient documentation

## 2017-05-19 MED ORDER — PENICILLIN V POTASSIUM 500 MG PO TABS: 500.0000 mg | ORAL_TABLET | Freq: Four times a day (QID) | ORAL | 0 refills | Status: AC

## 2017-05-19 NOTE — Discharge Instructions (Addendum)
Take tylenol and ibuprofen as needed for pain.

## 2017-05-19 NOTE — ED Provider Notes (Signed)
[facility]-EMERGENCY DEPT Provider Note   CSN: [id number] Arrival date & time: 05/19/17  1224     History   Chief Complaint Chief Complaint  Patient presents with  . Dental Pain    HPI Christian Reid is a 42 y.o. male with hx of HIV and syphilis here today with c/o dental pain. Patient reports that he has had a broken tooth on the right lower dental area for years. He states that food often gets stuck in the tooth and he digs it out. The past couple days the tooth has been painful and his dentist office is closed.   HPI  Past Medical History:  Diagnosis Date  . Anal fissure 02/10/2017  . Rash 05/05/2016  . Rectal bleeding 02/10/2017  . Rectal pain 09/23/2016    Patient Active Problem List   Diagnosis Date Noted  . Rectal bleeding 02/10/2017  . Anal fissure 02/10/2017  . Rectal pain 09/23/2016  . Acneiform dermatitis 05/05/2016  . Internal hemorrhoids 05/25/2009  . HIV disease (HCC) 06/01/2008  . SYPHILIS 06/01/2008    History reviewed. No pertinent surgical history.      Home Medications    Prior to Admission medications   Medication Sig Start Date End Date Taking? Authorizing Provider  darunavir-cobicistat (PREZCOBIX) 800-150 MG tablet Take 1 tablet by mouth daily with breakfast. 11/05/16  Yes Daiva Eves, Lisette Grinder, MD  emtricitabine-tenofovir AF (DESCOVY) 200-25 MG tablet Take 1 tablet by mouth daily. 11/05/16  Yes Daiva Eves, Lisette Grinder, MD  triamcinolone (KENALOG) 0.025 % ointment Apply 1 application topically daily. 09/23/16  Yes Daiva Eves, Lisette Grinder, MD  penicillin v potassium (VEETID) 500 MG tablet Take 1 tablet (500 mg total) by mouth 4 (four) times daily for 7 days. 05/19/17 05/26/17  Janne Napoleon, NP    Family History No family history on file.  Social History Social History   Tobacco Use  . Smoking status: Former Games developer  . Smokeless tobacco: Never Used  Substance Use Topics  . Alcohol use: Yes    Alcohol/week: 1.0 oz    Types: 2  Standard drinks or equivalent per week    Comment: occasional  . Drug use: Yes    Frequency: 2.0 times per week    Types: Marijuana     Allergies   Patient has no known allergies.   Review of Systems Review of Systems  HENT: Positive for dental problem.   Allergic/Immunologic: Positive for immunocompromised state.  All other systems reviewed and are negative.    Physical Exam Updated Vital Signs BP 121/81   Pulse 71   Temp 98.9 F (37.2 C)   Resp 18   SpO2 94%   Physical Exam  Constitutional: He appears well-developed and well-nourished. No distress.  HENT:  Head: Normocephalic.  Mouth/Throat:    Right lower first molar broken and decayed. There is swelling of the gum surrounding the tooth. No abscess.   Eyes: EOM are normal.  Neck: Neck supple.  Cardiovascular: Normal rate.  Pulmonary/Chest: Effort normal.  Musculoskeletal: Normal range of motion.  Lymphadenopathy:    He has no cervical adenopathy.  Skin: Skin is warm and dry.  Psychiatric: He has a normal mood and affect.  Nursing note and vitals reviewed.    ED Treatments / Results  Labs (all labs ordered are listed, but only abnormal results are displayed) Labs Reviewed - No data to display Radiology No results found.  Procedures Procedures (including critical care time)  Medications Ordered in ED  Medications - No data to display   Initial Impression / Assessment and Plan / ED Course  I have reviewed the triage vital signs and the nursing notes. Patient with toothache.  No gross abscess.  Exam unconcerning for Ludwig's angina or spread of infection.  Will treat with penicillin and anti-inflammatories medicine.  Urged patient to follow-up with dentist.    Final Clinical Impressions(s) / ED Diagnoses   Final diagnoses:  Closed fracture of tooth, initial encounter  Infected dental caries    ED Discharge Orders        Ordered    penicillin v potassium (VEETID) 500 MG tablet  4 times daily      05/19/17 1326       Kerrie Buffalo [place], [place] 05/19/17 2221    Arby Barrette, MD 05/22/17 1432

## 2017-05-19 NOTE — ED Triage Notes (Signed)
Pt c/o broken tooth on right lower side for year. Reports will get food stuck in it and will dig it out. Been having pain in that tooth for past couple days. Repots dentist he is supposed to be seeing office is closed.

## 2017-05-20 ENCOUNTER — Ambulatory Visit (INDEPENDENT_AMBULATORY_CARE_PROVIDER_SITE_OTHER): Payer: Self-pay | Admitting: Infectious Disease

## 2017-05-20 ENCOUNTER — Encounter: Payer: Self-pay | Admitting: Infectious Disease

## 2017-05-20 VITALS — BP 121/80 | HR 55 | Temp 98.0°F | Ht 71.0 in | Wt 202.0 lb

## 2017-05-20 DIAGNOSIS — Z113 Encounter for screening for infections with a predominantly sexual mode of transmission: Secondary | ICD-10-CM

## 2017-05-20 DIAGNOSIS — B20 Human immunodeficiency virus [HIV] disease: Secondary | ICD-10-CM

## 2017-05-20 DIAGNOSIS — L708 Other acne: Secondary | ICD-10-CM

## 2017-05-20 DIAGNOSIS — K602 Anal fissure, unspecified: Secondary | ICD-10-CM

## 2017-05-20 DIAGNOSIS — S025XXA Fracture of tooth (traumatic), initial encounter for closed fracture: Secondary | ICD-10-CM

## 2017-05-20 DIAGNOSIS — S025XXB Fracture of tooth (traumatic), initial encounter for open fracture: Secondary | ICD-10-CM

## 2017-05-20 DIAGNOSIS — K611 Rectal abscess: Secondary | ICD-10-CM

## 2017-05-20 DIAGNOSIS — K625 Hemorrhage of anus and rectum: Secondary | ICD-10-CM

## 2017-05-20 HISTORY — DX: Rectal abscess: K61.1

## 2017-05-20 HISTORY — DX: Fracture of tooth (traumatic), initial encounter for closed fracture: S02.5XXA

## 2017-05-20 LAB — COMPLETE METABOLIC PANEL WITH GFR
AG RATIO: 1.4 (calc) (ref 1.0–2.5)
ALT: 11 U/L (ref 9–46)
AST: 17 U/L (ref 10–40)
Albumin: 4.1 g/dL (ref 3.6–5.1)
Alkaline phosphatase (APISO): 67 U/L (ref 40–115)
BILIRUBIN TOTAL: 0.6 mg/dL (ref 0.2–1.2)
BUN: 13 mg/dL (ref 7–25)
CHLORIDE: 102 mmol/L (ref 98–110)
CO2: 29 mmol/L (ref 20–32)
Calcium: 9 mg/dL (ref 8.6–10.3)
Creat: 1.03 mg/dL (ref 0.60–1.35)
GFR, EST AFRICAN AMERICAN: 103 mL/min/{1.73_m2} (ref >=60)
GFR, Est Non African American: 89 mL/min/{1.73_m2} (ref >=60)
Globulin: 3 g/dL (calc) (ref 1.9–3.7)
Glucose, Bld: 85 mg/dL (ref 65–99)
POTASSIUM: 3.8 mmol/L (ref 3.5–5.3)
Sodium: 139 mmol/L (ref 135–146)
TOTAL PROTEIN: 7.1 g/dL (ref 6.1–8.1)

## 2017-05-20 LAB — CBC WITH DIFFERENTIAL/PLATELET
BASOS ABS: 21 {cells}/uL (ref 0–200)
Basophils Relative: 0.6 %
EOS PCT: 4.6 %
Eosinophils Absolute: 161 cells/uL (ref 15–500)
HCT: 37.9 % — ABNORMAL LOW (ref 38.5–50.0)
HEMOGLOBIN: 12.6 g/dL — AB (ref 13.2–17.1)
Lymphs Abs: 984 cells/uL (ref 850–3900)
MCH: 28.3 pg (ref 27.0–33.0)
MCHC: 33.2 g/dL (ref 32.0–36.0)
MCV: 85.2 fL (ref 80.0–100.0)
MONOS PCT: 13.8 %
MPV: 12 fL (ref 7.5–12.5)
NEUTROS ABS: 1852 {cells}/uL (ref 1500–7800)
NEUTROS PCT: 52.9 %
PLATELETS: 239 10*3/uL (ref 140–400)
RBC: 4.45 10*6/uL (ref 4.20–5.80)
RDW: 12.3 % (ref 11.0–15.0)
TOTAL LYMPHOCYTE: 28.1 %
WBC mixed population: 483 cells/uL (ref 200–950)
WBC: 3.5 10*3/uL — ABNORMAL LOW (ref 3.8–10.8)

## 2017-05-20 MED ORDER — DOXYCYCLINE HYCLATE 100 MG PO CAPS: 100.0000 mg | ORAL_CAPSULE | Freq: Two times a day (BID) | ORAL | 1 refills | Status: DC

## 2017-05-20 NOTE — Progress Notes (Signed)
Subjective:    Chief complaint: perirectal abscess and broken tooth     Patient ID: Christian Reid, male    DOB: 04/03/1975, 42 y.o.   MRN: [id number]  HPI  42 year old African [place] man previously taken care of by Traci Sermon and before that [employer]. HEh ad moved to [facility] where he was in care. He was on the run from the law and was then incarcerated in [place] and released from jail in January. Was off meds when we saw him for intake. He was restarted on PREZCOBIX and DEscovy.  He has noticed blood per rectum when defecating at several times on one occasion more significantly than others when I last saw him. This had improved but he has new problems.  #1 He developed a perirectal abscess at site of prior fistula surgery sp ER visit and doxy. He also had rectal swab checked - for GC and chlamdia but was also treated presumptively for GC and chlamydia with CTX IM and the doxy  He had improvement in pain and size of abscess and it partly drained yesterday.  However it is still considerable size and painful still to sit down though less so than before it burst  #2 He has a broken tooth in posterior R molar. He went to the ER recently and was given rx for PCN.        Past Medical History:  Diagnosis Date  . Anal fissure 02/10/2017  . Rash 05/05/2016  . Rectal bleeding 02/10/2017  . Rectal pain 09/23/2016    No past surgical history on file.  No family history on file.    Social History   Socioeconomic History  . Marital status: Single    Spouse name: Not on file  . Number of children: Not on file  . Years of education: Not on file  . Highest education level: Not on file  Occupational History  . Not on file  Social Needs  . Financial resource strain: Not on file  . Food insecurity:    Worry: Not on file    Inability: Not on file  . Transportation needs:    Medical: Not on file    Non-medical: Not on file  Tobacco Use  . Smoking status: Former  Games developer  . Smokeless tobacco: Never Used  Substance and Sexual Activity  . Alcohol use: Yes    Alcohol/week: 1.0 oz    Types: 2 Standard drinks or equivalent per week    Comment: occasional  . Drug use: Yes    Frequency: 2.0 times per week    Types: Marijuana  . Sexual activity: Yes    Partners: Male    Birth control/protection: Condom    Comment: declined condoms  Lifestyle  . Physical activity:    Days per week: Not on file    Minutes per session: Not on file  . Stress: Not on file  Relationships  . Social connections:    Talks on phone: Not on file    Gets together: Not on file    Attends religious service: Not on file    Active member of club or organization: Not on file    Attends meetings of clubs or organizations: Not on file    Relationship status: Not on file  Other Topics Concern  . Not on file  Social History Narrative  . Not on file    No Known Allergies   Current Outpatient Medications:  .  darunavir-cobicistat (PREZCOBIX) 800-150 MG  tablet, Take 1 tablet by mouth daily with breakfast., Disp: 30 tablet, Rfl: 11 .  doxycycline (VIBRAMYCIN) 100 MG capsule, Take 100 mg by mouth 2 (two) times daily., Disp: , Rfl:  .  emtricitabine-tenofovir AF (DESCOVY) 200-25 MG tablet, Take 1 tablet by mouth daily., Disp: 30 tablet, Rfl: 11 .  penicillin v potassium (VEETID) 500 MG tablet, Take 1 tablet (500 mg total) by mouth 4 (four) times daily for 7 days., Disp: 40 tablet, Rfl: 0 .  triamcinolone (KENALOG) 0.025 % ointment, Apply 1 application topically daily., Disp: 30 g, Rfl: 1  Review of Systems  Constitutional: Negative for chills and fever.  HENT: Positive for dental problem. Negative for congestion and sore throat.   Eyes: Negative for photophobia.  Respiratory: Negative for cough, shortness of breath and wheezing.   Cardiovascular: Negative for chest pain, palpitations and leg swelling.  Gastrointestinal: Positive for blood in stool and rectal pain. Negative for  abdominal pain, constipation, diarrhea, nausea and vomiting.  Genitourinary: Negative for discharge, dysuria, flank pain, hematuria, penile pain, penile swelling, scrotal swelling and testicular pain.  Musculoskeletal: Negative for back pain and myalgias.  Skin: Positive for wound. Negative for rash.  Neurological: Negative for dizziness, weakness and headaches.  Hematological: Does not bruise/bleed easily.  Psychiatric/Behavioral: Negative for suicidal ideas.       Objective:   Physical Exam  Constitutional: He is oriented to person, place, and time. He appears well-developed and well-nourished. No distress.  HENT:  Head: Normocephalic and atraumatic.  Mouth/Throat: No oropharyngeal exudate.    Eyes: Conjunctivae and EOM are normal. No scleral icterus.  Neck: Normal range of motion. Neck supple.  Cardiovascular: Normal rate and regular rhythm.  Pulmonary/Chest: Effort normal. No respiratory distress. He has no wheezes.  Abdominal: He exhibits no distension.  Genitourinary:     Musculoskeletal: He exhibits no edema or tenderness.  Neurological: He is alert and oriented to person, place, and time. He exhibits normal muscle tone. Coordination normal.  Skin: Skin is warm and dry. He is not diaphoretic. No erythema. No pallor.  Psychiatric: He has a normal mood and affect. His behavior is normal. Judgment and thought content normal.  Nursing note and vitals reviewed.  OP broken tooth 05/20/17:    Perirectal abscess:         Assessment & Plan:   HIV disease: continue Prezcobix and Descovy and renewed ADAP, Check labs today and bring back in July to renew ADAP again.  Perirectal abscess: doxycycline, warm compresses, and referral to general surgery  Broken tooth: referral to dental here  I spent greater than with the patient including greater than 50% of time in face to face counsel of the patient re how to treat his perirectal abscess seeing dental in  coordination of his care

## 2017-05-21 LAB — T-HELPER CELL (CD4) - (RCID CLINIC ONLY)
CD4 % Helper T Cell: 21 % — ABNORMAL LOW (ref 33–55)
CD4 T Cell Abs: 280 /uL — ABNORMAL LOW (ref 400–2700)

## 2017-05-21 LAB — URINE CYTOLOGY ANCILLARY ONLY
Chlamydia: NEGATIVE
Neisseria Gonorrhea: NEGATIVE

## 2017-05-21 LAB — RPR TITER

## 2017-05-21 LAB — FLUORESCENT TREPONEMAL AB(FTA)-IGG-BLD: FLUORESCENT TREPONEMAL ABS: REACTIVE — AB

## 2017-05-21 LAB — RPR: RPR: REACTIVE — AB

## 2017-05-22 ENCOUNTER — Telehealth: Payer: Self-pay | Admitting: *Deleted

## 2017-05-22 LAB — HIV-1 RNA ULTRAQUANT REFLEX TO GENTYP+
HIV 1 RNA QUANT: 23 {copies}/mL — AB
HIV-1 RNA Quant, Log: 1.37 Log cps/mL — ABNORMAL HIGH

## 2017-05-22 NOTE — Telephone Encounter (Signed)
Patient called to see if Dr Daiva Eves could prescribe something for dental pain. Please advise. Andree Coss, RN

## 2017-05-25 NOTE — Telephone Encounter (Signed)
I do not prescribe pain medications. He can be on ibuprofen 3x 200 mg every 6 hrs and Tylenol 1 gram every 6 hrs for pain

## 2017-05-26 NOTE — Telephone Encounter (Signed)
Relayed advice to patient. Andree Coss, RN

## 2017-05-26 NOTE — Addendum Note (Signed)
Addended by: Andree Coss on: 05/26/2017 02:46 PM   Modules accepted: Orders

## 2017-06-04 ENCOUNTER — Telehealth: Payer: Self-pay | Admitting: *Deleted

## 2017-06-04 NOTE — Telephone Encounter (Signed)
He can have augmentin 875/125 bid sx 14 days

## 2017-06-04 NOTE — Telephone Encounter (Signed)
Patient called and advised he is still having trouble with the broken tooth noted at his visit 05/21/17. He wants to know if Dr Zenaida Niece dam will send in an Rx for an antibiotic to the pharmacy. Advised provider is not in the office but that I will call him once he responds we will call him back.

## 2017-06-05 MED ORDER — AMOXICILLIN-POT CLAVULANATE 875-125 MG PO TABS: 1.0000 | ORAL_TABLET | Freq: Two times a day (BID) | ORAL | 0 refills | Status: DC

## 2017-06-05 NOTE — Addendum Note (Signed)
Addended by: Lurlean Leyden on: 06/05/2017 04:55 PM   Modules accepted: Orders

## 2017-06-22 ENCOUNTER — Ambulatory Visit: Payer: Self-pay | Admitting: Surgery

## 2017-06-26 ENCOUNTER — Ambulatory Visit: Payer: Self-pay | Admitting: Gastroenterology

## 2017-07-21 ENCOUNTER — Ambulatory Visit: Payer: Self-pay

## 2017-07-28 ENCOUNTER — Other Ambulatory Visit: Payer: Self-pay

## 2017-07-28 ENCOUNTER — Ambulatory Visit: Payer: Self-pay

## 2017-07-28 DIAGNOSIS — B20 Human immunodeficiency virus [HIV] disease: Secondary | ICD-10-CM

## 2017-07-28 DIAGNOSIS — L708 Other acne: Secondary | ICD-10-CM

## 2017-07-28 DIAGNOSIS — Z79899 Other long term (current) drug therapy: Secondary | ICD-10-CM

## 2017-07-29 LAB — CBC WITH DIFFERENTIAL/PLATELET
BASOS PCT: 0.6 %
Basophils Absolute: 21 cells/uL (ref 0–200)
Eosinophils Absolute: 109 cells/uL (ref 15–500)
Eosinophils Relative: 3.1 %
HEMATOCRIT: 40.6 % (ref 38.5–50.0)
HEMOGLOBIN: 13.5 g/dL (ref 13.2–17.1)
LYMPHS ABS: 973 {cells}/uL (ref 850–3900)
MCH: 28.4 pg (ref 27.0–33.0)
MCHC: 33.3 g/dL (ref 32.0–36.0)
MCV: 85.3 fL (ref 80.0–100.0)
MPV: 12.9 fL — AB (ref 7.5–12.5)
Monocytes Relative: 11.6 %
Neutro Abs: 1992 cells/uL (ref 1500–7800)
Neutrophils Relative %: 56.9 %
PLATELETS: 199 10*3/uL (ref 140–400)
RBC: 4.76 10*6/uL (ref 4.20–5.80)
RDW: 12.5 % (ref 11.0–15.0)
TOTAL LYMPHOCYTE: 27.8 %
WBC: 3.5 10*3/uL — AB (ref 3.8–10.8)
WBCMIX: 406 {cells}/uL (ref 200–950)

## 2017-07-29 LAB — COMPLETE METABOLIC PANEL WITH GFR
AG RATIO: 1.3 (calc) (ref 1.0–2.5)
ALBUMIN MSPROF: 4.2 g/dL (ref 3.6–5.1)
ALT: 12 U/L (ref 9–46)
AST: 17 U/L (ref 10–40)
Alkaline phosphatase (APISO): 76 U/L (ref 40–115)
BILIRUBIN TOTAL: 0.5 mg/dL (ref 0.2–1.2)
BUN: 16 mg/dL (ref 7–25)
CALCIUM: 9.5 mg/dL (ref 8.6–10.3)
CHLORIDE: 101 mmol/L (ref 98–110)
CO2: 31 mmol/L (ref 20–32)
Creat: 1.09 mg/dL (ref 0.60–1.35)
GFR, EST AFRICAN AMERICAN: 97 mL/min/{1.73_m2} (ref >=60)
GFR, EST NON AFRICAN AMERICAN: 83 mL/min/{1.73_m2} (ref >=60)
GLOBULIN: 3.2 g/dL (ref 1.9–3.7)
Glucose, Bld: 88 mg/dL (ref 65–99)
POTASSIUM: 4.1 mmol/L (ref 3.5–5.3)
SODIUM: 139 mmol/L (ref 135–146)
TOTAL PROTEIN: 7.4 g/dL (ref 6.1–8.1)

## 2017-07-29 LAB — URINE CYTOLOGY ANCILLARY ONLY
CHLAMYDIA, DNA PROBE: NEGATIVE
Neisseria Gonorrhea: NEGATIVE

## 2017-07-29 LAB — T-HELPER CELL (CD4) - (RCID CLINIC ONLY)
CD4 % Helper T Cell: 21 % — ABNORMAL LOW (ref 33–55)
CD4 T Cell Abs: 220 /uL — ABNORMAL LOW (ref 400–2700)

## 2017-07-29 LAB — LIPID PANEL
CHOL/HDL RATIO: 2.6 (calc) (ref <5.0)
CHOLESTEROL: 185 mg/dL (ref <200)
HDL: 70 mg/dL (ref >40)
LDL CHOLESTEROL (CALC): 92 mg/dL
Non-HDL Cholesterol (Calc): 115 mg/dL (calc) (ref <130)
Triglycerides: 135 mg/dL (ref <150)

## 2017-07-29 LAB — RPR: RPR Ser Ql: REACTIVE — AB

## 2017-07-29 LAB — RPR TITER: RPR Titer: 1:1 {titer} — ABNORMAL HIGH

## 2017-07-29 LAB — FLUORESCENT TREPONEMAL AB(FTA)-IGG-BLD: Fluorescent Treponemal ABS: REACTIVE — AB

## 2017-07-30 LAB — HIV-1 RNA QUANT-NO REFLEX-BLD
HIV 1 RNA Quant: 139 copies/mL — ABNORMAL HIGH
HIV-1 RNA Quant, Log: 2.14 Log copies/mL — ABNORMAL HIGH

## 2017-08-06 ENCOUNTER — Encounter: Payer: Self-pay | Admitting: Infectious Disease

## 2017-08-11 ENCOUNTER — Encounter: Payer: Self-pay | Admitting: Infectious Disease

## 2017-08-11 ENCOUNTER — Ambulatory Visit (INDEPENDENT_AMBULATORY_CARE_PROVIDER_SITE_OTHER): Payer: Self-pay | Admitting: Infectious Disease

## 2017-08-11 VITALS — BP 133/80 | HR 67 | Temp 97.8°F | Wt 207.0 lb

## 2017-08-11 DIAGNOSIS — K611 Rectal abscess: Secondary | ICD-10-CM

## 2017-08-11 DIAGNOSIS — A539 Syphilis, unspecified: Secondary | ICD-10-CM

## 2017-08-11 DIAGNOSIS — B20 Human immunodeficiency virus [HIV] disease: Secondary | ICD-10-CM

## 2017-08-11 MED ORDER — DARUN-COBIC-EMTRICIT-TENOFAF 800-150-200-10 MG PO TABS: 1.0000 | ORAL_TABLET | Freq: Every day | ORAL | 11 refills | Status: DC

## 2017-08-11 NOTE — Patient Instructions (Signed)
Your new complete regimen will be:  SYMTUZA one pill once daily

## 2017-08-11 NOTE — Progress Notes (Signed)
Subjective:    Chief complaint:  followup for HIV on medications     Patient ID: Christian Reid, male    DOB: 1975-04-28, 42 y.o.   MRN: [id number]  HPI  42 year old African [place] man previously taken care of by Traci Sermon and before that [employer]. HEh ad moved to [facility] where he was in care. He was on the run from the law and was then incarcerated in [place] and released from jail in January. Was off meds when we saw him for intake. He was restarted on PREZCOBIX and DEscovy.   He has been reasonably though not perfectly suppressed.   He developed a perirectal abscess that responded to protracted oral doxy. He nearly had surgery with Dr. Karie Soda .          Past Medical History:  Diagnosis Date  . Anal fissure 02/10/2017  . Broken tooth 05/20/2017  . Perirectal abscess 05/20/2017  . Rash 05/05/2016  . Rectal bleeding 02/10/2017  . Rectal pain 09/23/2016    No past surgical history on file.  No family history on file.    Social History   Socioeconomic History  . Marital status: Single    Spouse name: Not on file  . Number of children: Not on file  . Years of education: Not on file  . Highest education level: Not on file  Occupational History  . Not on file  Social Needs  . Financial resource strain: Not on file  . Food insecurity:    Worry: Not on file    Inability: Not on file  . Transportation needs:    Medical: Not on file    Non-medical: Not on file  Tobacco Use  . Smoking status: Former Games developer  . Smokeless tobacco: Never Used  Substance and Sexual Activity  . Alcohol use: Yes    Alcohol/week: 1.2 oz    Types: 2 Standard drinks or equivalent per week    Comment: occasional  . Drug use: Yes    Frequency: 2.0 times per week    Types: Marijuana  . Sexual activity: Yes    Partners: Male    Birth control/protection: Condom    Comment: declined condoms  Lifestyle  . Physical activity:    Days per week: Not on file    Minutes per  session: Not on file  . Stress: Not on file  Relationships  . Social connections:    Talks on phone: Not on file    Gets together: Not on file    Attends religious service: Not on file    Active member of club or organization: Not on file    Attends meetings of clubs or organizations: Not on file    Relationship status: Not on file  Other Topics Concern  . Not on file  Social History Narrative  . Not on file    No Known Allergies   Current Outpatient Medications:  .  Darunavir-Cobicisctat-Emtricitabine-Tenofovir Alafenamide (SYMTUZA) 800-150-200-10 MG TABS, Take 1 tablet by mouth daily with breakfast., Disp: 30 tablet, Rfl: 11  Review of Systems  Constitutional: Negative for activity change, appetite change, chills, diaphoresis, fatigue, fever and unexpected weight change.  HENT: Positive for dental problem. Negative for congestion, rhinorrhea, sinus pressure, sneezing, sore throat and trouble swallowing.   Eyes: Negative for photophobia and visual disturbance.  Respiratory: Negative for cough, chest tightness, shortness of breath, wheezing and stridor.   Cardiovascular: Negative for chest pain, palpitations and leg swelling.  Gastrointestinal: Negative  for abdominal distention, abdominal pain, anal bleeding, blood in stool, constipation, diarrhea, nausea and vomiting.  Genitourinary: Negative for difficulty urinating, discharge, dysuria, flank pain, hematuria, penile pain, penile swelling, scrotal swelling and testicular pain.  Musculoskeletal: Negative for arthralgias, back pain, gait problem, joint swelling and myalgias.  Skin: Negative for color change, pallor, rash and wound.  Neurological: Negative for dizziness, tremors, weakness, light-headedness and headaches.  Hematological: Negative for adenopathy. Does not bruise/bleed easily.  Psychiatric/Behavioral: Negative for agitation, behavioral problems, confusion, decreased concentration, dysphoric mood, sleep disturbance and  suicidal ideas.       Objective:   Physical Exam  Constitutional: He is oriented to person, place, and time. He appears well-developed and well-nourished. No distress.  HENT:  Head: Normocephalic and atraumatic.  Mouth/Throat: No oropharyngeal exudate.    Eyes: Conjunctivae and EOM are normal. No scleral icterus.  Neck: Normal range of motion. Neck supple.  Cardiovascular: Normal rate and regular rhythm.  Pulmonary/Chest: Effort normal. No respiratory distress. He has no wheezes.  Abdominal: He exhibits no distension.  Musculoskeletal: He exhibits no edema or tenderness.  Neurological: He is alert and oriented to person, place, and time. He exhibits normal muscle tone. Coordination normal.  Skin: Skin is warm and dry. He is not diaphoretic. No erythema. No pallor.  Psychiatric: He has a normal mood and affect. His behavior is normal. Judgment and thought content normal.  Nursing note and vitals reviewed.        Assessment & Plan:   HIV disease: HMAP is renewed. Recheck VL. He admits to missing at least one dose a week. [facility]  Perirectal abscess: reportedly resolved  Syphilis: titer now low  Broken tooth: referral to dental here

## 2017-08-15 LAB — HIV RNA, RTPCR W/R GT (RTI, PI,INT)
HIV 1 RNA Quant: 83 copies/mL — ABNORMAL HIGH
HIV-1 RNA Quant, Log: 1.92 Log copies/mL — ABNORMAL HIGH

## 2017-11-26 ENCOUNTER — Other Ambulatory Visit: Payer: Self-pay

## 2017-12-14 ENCOUNTER — Encounter: Payer: Self-pay | Admitting: Infectious Disease

## 2022-12-29 ENCOUNTER — Ambulatory Visit (INDEPENDENT_AMBULATORY_CARE_PROVIDER_SITE_OTHER): Payer: Medicaid Other | Admitting: Infectious Disease

## 2022-12-29 ENCOUNTER — Encounter: Payer: Self-pay | Admitting: Infectious Disease

## 2022-12-29 ENCOUNTER — Other Ambulatory Visit (HOSPITAL_COMMUNITY): Payer: Self-pay

## 2022-12-29 ENCOUNTER — Other Ambulatory Visit: Payer: Self-pay

## 2022-12-29 ENCOUNTER — Ambulatory Visit: Payer: Medicaid Other

## 2022-12-29 VITALS — BP 153/92 | HR 81 | Temp 97.9°F | Ht 71.0 in | Wt 212.0 lb

## 2022-12-29 DIAGNOSIS — B20 Human immunodeficiency virus [HIV] disease: Secondary | ICD-10-CM

## 2022-12-29 DIAGNOSIS — Z113 Encounter for screening for infections with a predominantly sexual mode of transmission: Secondary | ICD-10-CM

## 2022-12-29 DIAGNOSIS — E785 Hyperlipidemia, unspecified: Secondary | ICD-10-CM | POA: Diagnosis not present

## 2022-12-29 DIAGNOSIS — Z23 Encounter for immunization: Secondary | ICD-10-CM

## 2022-12-29 DIAGNOSIS — A539 Syphilis, unspecified: Secondary | ICD-10-CM

## 2022-12-29 DIAGNOSIS — Z7185 Encounter for immunization safety counseling: Secondary | ICD-10-CM | POA: Diagnosis not present

## 2022-12-29 HISTORY — DX: Hyperlipidemia, unspecified: E78.5

## 2022-12-29 MED ORDER — ATORVASTATIN CALCIUM 10 MG PO TABS
10.0000 mg | ORAL_TABLET | Freq: Every day | ORAL | 11 refills | Status: DC
  Filled 2022-12-29: qty 30, 30d supply, fill #0
  Filled 2023-01-29: qty 30, 30d supply, fill #1

## 2022-12-29 NOTE — Addendum Note (Signed)
Addended by: Philippa Chester on: 12/29/2022 03:49 PM   Modules accepted: Orders

## 2022-12-29 NOTE — Addendum Note (Signed)
Addended by: Harley Alto on: 12/29/2022 04:03 PM   Modules accepted: Orders

## 2022-12-29 NOTE — Progress Notes (Signed)
Subjective:  Chief complaint reestablish care for HIV disease  Patient ID: Christian Reid, male    DOB: 08/12/1975, 47 y.o.   MRN: [id number]  HPI  Christian Reid is a 47 year old black man previously taken care of by Traci Sermon and then Yisroel Ramming prior to that he then moved to [facility] where he was in care.  When I last saw him he had been incarcerated in [place] and released from jail and he was on Prezcobix and DESCOVY.  We ultimately switched him over to [facility] he had had a perirectal abscess responded to protracted oral doxycycline and was followed by Dr. Michaell Cowing with surgery.  Since I last saw him in 2019 he was again incarcerated and was in prison for 5 years.  In jail they switched him over to [place] and he was suppressed on Biktarvy but now he tells me that he was told by the nurse that him the prison system he had a viral load that had gone from 20,000-40,000 copies which does not make sense if he did not develop resistance to his single tablet integrase strand transfer inhibitor.   He is on atorvastatin for hyperlipidemia:  Past Medical History:  Diagnosis Date   Anal fissure 02/10/2017   Broken tooth 05/20/2017   Hyperlipidemia 12/29/2022   Perirectal abscess 05/20/2017   Rash 05/05/2016   Rectal bleeding 02/10/2017   Rectal pain 09/23/2016    No past surgical history on file.  No family history on file.    Social History   Socioeconomic History   Marital status: Single    Spouse name: Not on file   Number of children: Not on file   Years of education: Not on file   Highest education level: Not on file  Occupational History   Not on file  Tobacco Use   Smoking status: Former   Smokeless tobacco: Never  Vaping Use   Vaping status: Not on file  Substance and Sexual Activity   Alcohol use: Yes    Alcohol/week: 2.0 standard drinks of alcohol    Types: 2 Standard drinks or equivalent per week    Comment: occasional   Drug use: Not Currently     Frequency: 2.0 times per week    Types: Marijuana   Sexual activity: Yes    Partners: Male    Birth control/protection: Condom    Comment: declined condoms  Other Topics Concern   Not on file  Social History Narrative   Not on file   Social Determinants of Health   Financial Resource Strain: Not on file  Food Insecurity: Not on file  Transportation Needs: Not on file  Physical Activity: Not on file  Stress: Not on file  Social Connections: Not on file    No Known Allergies   Current Outpatient Medications:    bictegravir-emtricitabine-tenofovir AF (BIKTARVY) 50-200-25 MG TABS tablet, Take by mouth daily., Disp: , Rfl:    atorvastatin (LIPITOR) 10 MG tablet, Take 1 tablet (10 mg total) by mouth daily., Disp: 30 tablet, Rfl: 11   Darunavir-Cobicisctat-Emtricitabine-Tenofovir Alafenamide (SYMTUZA) 800-150-200-10 MG TABS, Take 1 tablet by mouth daily with breakfast. (Patient not taking: Reported on 12/29/2022), Disp: 30 tablet, Rfl: 11   Review of Systems  Constitutional:  Negative for activity change, appetite change, chills, diaphoresis, fatigue, fever and unexpected weight change.  HENT:  Negative for congestion, rhinorrhea, sinus pressure, sneezing, sore throat and trouble swallowing.   Eyes:  Negative for photophobia and visual disturbance.  Respiratory:  Negative for  cough, chest tightness, shortness of breath, wheezing and stridor.   Cardiovascular:  Negative for chest pain, palpitations and leg swelling.  Gastrointestinal:  Negative for abdominal distention, abdominal pain, anal bleeding, blood in stool, constipation, diarrhea, nausea and vomiting.  Genitourinary:  Negative for difficulty urinating, dysuria, flank pain and hematuria.  Musculoskeletal:  Negative for arthralgias, back pain, gait problem, joint swelling and myalgias.  Skin:  Negative for color change, pallor, rash and wound.  Neurological:  Negative for dizziness, tremors, weakness and light-headedness.   Hematological:  Negative for adenopathy. Does not bruise/bleed easily.  Psychiatric/Behavioral:  Negative for agitation, behavioral problems, confusion, decreased concentration, dysphoric mood and sleep disturbance.        Objective:   Physical Exam Constitutional:      Appearance: He is well-developed.  HENT:     Head: Normocephalic and atraumatic.  Eyes:     Conjunctiva/sclera: Conjunctivae normal.  Cardiovascular:     Rate and Rhythm: Normal rate and regular rhythm.  Pulmonary:     Effort: Pulmonary effort is normal. No respiratory distress.     Breath sounds: No wheezing.  Abdominal:     General: There is no distension.     Palpations: Abdomen is soft.  Musculoskeletal:        General: No tenderness. Normal range of motion.     Cervical back: Normal range of motion and neck supple.  Skin:    General: Skin is warm and dry.     Coloration: Skin is not pale.     Findings: No erythema or rash.  Neurological:     General: No focal deficit present.     Mental Status: He is alert and oriented to person, place, and time.  Psychiatric:        Mood and Affect: Mood normal.        Behavior: Behavior normal.        Thought Content: Thought content normal.        Judgment: Judgment normal.           Assessment & Plan:   HIV disease:  Will check a viral load with reflex to genotype including integrates strand transfer inhibitor resistance along with CD4 count CMP CBC with differential.  If he has developed integrase strand transfer inhibitor resistance we will switch to a protease inhibitor based regimen.  Hopefully this is not the case and there was a miscommunication between the staff and himself.  Would seem very unusual for him to be getting BIKTARVY for 5 years and suddenly have viremia with resistance  Hyperlipidemia will continue his atorvastatin  STI screening screen for gonorrhea and chlamydia in the urine oropharynx and rectum also check a syphilis  titer  Anal cancer prevention anal Pap smear obtained  Vaccine counseling recommended and he was given updated COVID-19 and influenza vaccinations

## 2022-12-30 LAB — T-HELPER CELLS (CD4) COUNT (NOT AT ARMC)
CD4 % Helper T Cell: 30 % — ABNORMAL LOW (ref 33–65)
CD4 T Cell Abs: 328 /uL — ABNORMAL LOW (ref 400–1790)

## 2022-12-31 LAB — CYTOLOGY, (ORAL, ANAL, URETHRAL) ANCILLARY ONLY
Chlamydia: NEGATIVE
Chlamydia: NEGATIVE
Comment: NEGATIVE
Comment: NORMAL
Comment: NEGATIVE
Comment: NORMAL
Neisseria Gonorrhea: NEGATIVE
Neisseria Gonorrhea: NEGATIVE

## 2022-12-31 LAB — URINE CYTOLOGY ANCILLARY ONLY
Chlamydia: NEGATIVE
Comment: NEGATIVE
Comment: NORMAL
Neisseria Gonorrhea: NEGATIVE

## 2023-01-01 LAB — COMPLETE METABOLIC PANEL WITH GFR
AG Ratio: 1.6 (calc) (ref 1.0–2.5)
ALT: 20 U/L (ref 9–46)
AST: 23 U/L (ref 10–40)
Albumin: 4.7 g/dL (ref 3.6–5.1)
Alkaline phosphatase (APISO): 77 U/L (ref 36–130)
BUN: 17 mg/dL (ref 7–25)
CO2: 29 mmol/L (ref 20–32)
Calcium: 10 mg/dL (ref 8.6–10.3)
Chloride: 101 mmol/L (ref 98–110)
Creat: 1.02 mg/dL (ref 0.60–1.29)
Globulin: 2.9 g/dL (ref 1.9–3.7)
Glucose, Bld: 84 mg/dL (ref 65–99)
Potassium: 3.9 mmol/L (ref 3.5–5.3)
Sodium: 140 mmol/L (ref 135–146)
Total Bilirubin: 0.5 mg/dL (ref 0.2–1.2)
Total Protein: 7.6 g/dL (ref 6.1–8.1)
eGFR: 91 mL/min/{1.73_m2} (ref >=60)

## 2023-01-01 LAB — CBC WITH DIFFERENTIAL/PLATELET
Absolute Lymphocytes: 1143 {cells}/uL (ref 850–3900)
Absolute Monocytes: 390 {cells}/uL (ref 200–950)
Basophils Absolute: 20 {cells}/uL (ref 0–200)
Basophils Relative: 0.5 %
Eosinophils Absolute: 70 {cells}/uL (ref 15–500)
Eosinophils Relative: 1.8 %
HCT: 46.5 % (ref 38.5–50.0)
Hemoglobin: 14.8 g/dL (ref 13.2–17.1)
MCH: 27.4 pg (ref 27.0–33.0)
MCHC: 31.8 g/dL — ABNORMAL LOW (ref 32.0–36.0)
MCV: 86 fL (ref 80.0–100.0)
MPV: 13.1 fL — ABNORMAL HIGH (ref 7.5–12.5)
Monocytes Relative: 10 %
Neutro Abs: 2278 {cells}/uL (ref 1500–7800)
Neutrophils Relative %: 58.4 %
Platelets: 187 10*3/uL (ref 140–400)
RBC: 5.41 10*6/uL (ref 4.20–5.80)
RDW: 12.3 % (ref 11.0–15.0)
Total Lymphocyte: 29.3 %
WBC: 3.9 10*3/uL (ref 3.8–10.8)

## 2023-01-01 LAB — LIPID PANEL
Cholesterol: 158 mg/dL (ref <200)
HDL: 71 mg/dL (ref >=40)
LDL Cholesterol (Calc): 67 mg/dL
Non-HDL Cholesterol (Calc): 87 mg/dL (ref <130)
Total CHOL/HDL Ratio: 2.2 (calc) (ref <5.0)
Triglycerides: 122 mg/dL (ref <150)

## 2023-01-01 LAB — HIV RNA, RTPCR W/R GT (RTI, PI,INT)
HIV 1 RNA Quant: 59 {copies}/mL — ABNORMAL HIGH
HIV-1 RNA Quant, Log: 1.77 {Log_copies}/mL — ABNORMAL HIGH

## 2023-01-01 LAB — RPR: RPR Ser Ql: REACTIVE — AB

## 2023-01-01 LAB — RPR TITER: RPR Titer: 1:1 {titer} — ABNORMAL HIGH

## 2023-01-01 LAB — T PALLIDUM AB: T Pallidum Abs: POSITIVE — AB

## 2023-01-02 ENCOUNTER — Other Ambulatory Visit: Payer: Self-pay

## 2023-01-02 ENCOUNTER — Other Ambulatory Visit: Payer: Self-pay | Admitting: Pharmacist

## 2023-01-02 ENCOUNTER — Telehealth: Payer: Self-pay

## 2023-01-02 ENCOUNTER — Other Ambulatory Visit (HOSPITAL_COMMUNITY): Payer: Self-pay

## 2023-01-02 DIAGNOSIS — B20 Human immunodeficiency virus [HIV] disease: Secondary | ICD-10-CM

## 2023-01-02 LAB — CYTOLOGY - PAP
Comment: NEGATIVE
Diagnosis: NEGATIVE
Diagnosis: REACTIVE
High risk HPV: NEGATIVE

## 2023-01-02 MED ORDER — BICTEGRAVIR-EMTRICITAB-TENOFOV 50-200-25 MG PO TABS
1.0000 | ORAL_TABLET | Freq: Every day | ORAL | 2 refills | Status: DC
  Filled 2023-01-02: qty 30, 30d supply, fill #0
  Filled 2023-01-29: qty 30, 30d supply, fill #1
  Filled 2023-02-25: qty 30, 30d supply, fill #2

## 2023-01-02 NOTE — Telephone Encounter (Signed)
-----   Message from [place] sent at 01/01/2023  8:51 PM EST ----- Regarding: FW: Pt w a viral load of 57 this is NOT c/w virolgical failure w R on bik as I feared based on what he said his RN in jail said about VLs in 20-40 k range. Therefore can restart his biktarvy.  Need to seen to the appropriate pharmacy ----- Message ----- From: Interface, [facility] Lab Results In Sent: 12/29/2022  11:03 PM EST To: Randall Hiss, MD

## 2023-01-02 NOTE — Telephone Encounter (Signed)
Spoke with Jillyn Hidden and discussed that his viral load I sonly 57 which is not consistent with virological failure. Relayed that Dr. Daiva Eves would like him to restart his Biktarvy. He requested it be sent to [facility].   Sandie Ano, RN

## 2023-01-02 NOTE — Progress Notes (Signed)
Specialty Pharmacy Initiation Note   Christian Reid is a 47 y.o. male who will be followed by the specialty pharmacy service for RxSp HIV    Review of administration, indication, effectiveness, safety, potential side effects, storage/disposable, and missed dose instructions occurred today for patient's specialty medication(s) Bictegravir-Emtricitab-Tenofov [facility])     Patient/Caregiver did not have any additional questions or concerns.   Patient's therapy is appropriate to: Continue    Goals Addressed             This Visit's Progress    Achieve Undetectable HIV Viral Load < 20       Patient is on track. Patient will maintain adherence      Comply with lab assessments       Patient is on track. Patient will adhere to provider and/or lab appointments      Increase CD4 count until steady state       Patient is on track. Patient will maintain adherence      Maintain optimal adherence to therapy       Patient is on track. Patient will maintain adherence         Jennette Kettle Specialty Pharmacist

## 2023-01-02 NOTE — Progress Notes (Signed)
Specialty Pharmacy Initial Fill Coordination Note  Christian Reid is a 47 y.o. male contacted today regarding initial fill of specialty medication(s) Bictegravir-Emtricitab-Tenofov [name])   Patient requested [name] at [facility] at [place] date: 01/06/23   Medication will be filled on 01/05/23.   Patient is aware of $0 copayment.

## 2023-01-05 ENCOUNTER — Other Ambulatory Visit (HOSPITAL_COMMUNITY): Payer: Self-pay

## 2023-01-06 ENCOUNTER — Other Ambulatory Visit (HOSPITAL_COMMUNITY): Payer: Self-pay

## 2023-01-14 NOTE — Progress Notes (Deleted)
   Subjective:    Patient ID: Christian Reid, male    DOB: [birth year], 47 y.o.   MRN: [id number]  HPI  Past Medical History:  Diagnosis Date   Anal fissure 02/10/2017   Broken tooth 05/20/2017   Hyperlipidemia 12/29/2022   Perirectal abscess 05/20/2017   Rash 05/05/2016   Rectal bleeding 02/10/2017   Rectal pain 09/23/2016    No past surgical history on file.  No family history on file.    Social History   Socioeconomic History   Marital status: Single    Spouse name: Not on file   Number of children: Not on file   Years of education: Not on file   Highest education level: Not on file  Occupational History   Not on file  Tobacco Use   Smoking status: Former   Smokeless tobacco: Never  Vaping Use   Vaping status: Not on file  Substance and Sexual Activity   Alcohol use: Yes    Alcohol/week: 2.0 standard drinks of alcohol    Types: 2 Standard drinks or equivalent per week    Comment: occasional   Drug use: Not Currently    Frequency: 2.0 times per week    Types: Marijuana   Sexual activity: Yes    Partners: Male    Birth control/protection: Condom    Comment: declined condoms  Other Topics Concern   Not on file  Social History Narrative   Not on file   Social Drivers of Health   Financial Resource Strain: Not on file  Food Insecurity: Not on file  Transportation Needs: Not on file  Physical Activity: Not on file  Stress: Not on file  Social Connections: Not on file    No Known Allergies   Current Outpatient Medications:    atorvastatin (LIPITOR) 10 MG tablet, Take 1 tablet (10 mg total) by mouth daily., Disp: 30 tablet, Rfl: 11   bictegravir-emtricitabine-tenofovir AF (BIKTARVY) 50-200-25 MG TABS tablet, Take 1 tablet by mouth daily., Disp: 30 tablet, Rfl: 2   Review of Systems     Objective:   Physical Exam        Assessment & Plan:

## 2023-01-15 ENCOUNTER — Ambulatory Visit: Payer: Medicaid Other | Admitting: Infectious Disease

## 2023-01-15 DIAGNOSIS — E785 Hyperlipidemia, unspecified: Secondary | ICD-10-CM

## 2023-01-15 DIAGNOSIS — A539 Syphilis, unspecified: Secondary | ICD-10-CM

## 2023-01-15 DIAGNOSIS — B20 Human immunodeficiency virus [HIV] disease: Secondary | ICD-10-CM

## 2023-01-20 ENCOUNTER — Other Ambulatory Visit: Payer: Self-pay

## 2023-01-27 ENCOUNTER — Other Ambulatory Visit: Payer: Self-pay

## 2023-01-29 ENCOUNTER — Other Ambulatory Visit (HOSPITAL_COMMUNITY): Payer: Self-pay

## 2023-01-29 ENCOUNTER — Other Ambulatory Visit: Payer: Self-pay

## 2023-01-29 NOTE — Progress Notes (Signed)
 Specialty Pharmacy Refill Coordination Note  Christian Reid is a 48 y.o. male contacted today regarding refills of specialty medication(s) Bictegravir-Emtricitab-Tenofov (BIKTARVY )   Patient requested [name] at [facility] at [place] date: 01/29/23   Medication will be filled on 01.09.25.

## 2023-01-30 ENCOUNTER — Other Ambulatory Visit (HOSPITAL_COMMUNITY): Payer: Self-pay

## 2023-02-25 ENCOUNTER — Other Ambulatory Visit: Payer: Self-pay

## 2023-02-25 NOTE — Progress Notes (Signed)
 Specialty Pharmacy Refill Coordination Note  Christian Reid is a 48 y.o. male contacted today regarding refills of specialty medication(s) Bictegravir-Emtricitab-Tenofov (BIKTARVY )   Patient requested [name] at [facility] at [place] date: 02/27/23   Medication will be filled on 02/26/23.

## 2023-02-26 ENCOUNTER — Encounter (HOSPITAL_COMMUNITY): Payer: Self-pay

## 2023-02-26 ENCOUNTER — Other Ambulatory Visit: Payer: Self-pay

## 2023-02-26 NOTE — ED Triage Notes (Signed)
 Tightness in chest SOB X2 days. No history of respiratory issues. States a few days ago he had some flu like symptoms but that cleared up. 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 Has been slightly light headed.   Patient states his job is not strenuous.

## 2023-03-02 ENCOUNTER — Ambulatory Visit: Payer: Medicaid Other | Admitting: Infectious Disease

## 2023-03-11 ENCOUNTER — Other Ambulatory Visit (HOSPITAL_COMMUNITY): Payer: Self-pay

## 2023-03-11 ENCOUNTER — Other Ambulatory Visit (HOSPITAL_BASED_OUTPATIENT_CLINIC_OR_DEPARTMENT_OTHER): Payer: Self-pay

## 2023-03-11 MED ORDER — ATORVASTATIN CALCIUM 10 MG PO TABS
10.0000 mg | ORAL_TABLET | Freq: Every day | ORAL | 3 refills | Status: AC
  Filled 2023-03-11: qty 90, 90d supply, fill #0
  Filled 2023-06-22: qty 90, 90d supply, fill #1
  Filled 2023-10-19: qty 90, 90d supply, fill #2
  Filled 2024-01-22 (×3): qty 90, 90d supply, fill #3

## 2023-03-16 ENCOUNTER — Other Ambulatory Visit: Payer: Self-pay

## 2023-03-17 ENCOUNTER — Other Ambulatory Visit (HOSPITAL_COMMUNITY): Payer: Self-pay

## 2023-03-17 ENCOUNTER — Other Ambulatory Visit: Payer: Self-pay | Admitting: Pharmacy Technician

## 2023-03-17 ENCOUNTER — Other Ambulatory Visit: Payer: Self-pay | Admitting: Infectious Disease

## 2023-03-17 ENCOUNTER — Other Ambulatory Visit: Payer: Self-pay

## 2023-03-17 DIAGNOSIS — B20 Human immunodeficiency virus [HIV] disease: Secondary | ICD-10-CM

## 2023-03-17 NOTE — Progress Notes (Signed)
 Specialty Pharmacy Refill Coordination Note  Christian Reid is a 48 y.o. male contacted today regarding refills of specialty medication(s) Bictegravir-Emtricitab-Tenofov [name])   Patient requested [name] at [facility] at [place] date: 03/31/23   Medication will be filled on 03/30/23.  RR sent to MD

## 2023-03-19 ENCOUNTER — Other Ambulatory Visit (HOSPITAL_COMMUNITY): Payer: Self-pay

## 2023-03-19 MED ORDER — BIKTARVY 50-200-25 MG PO TABS
1.0000 | ORAL_TABLET | Freq: Every day | ORAL | 2 refills | Status: DC
  Filled 2023-03-19: qty 30, 30d supply, fill #0
  Filled 2023-04-23: qty 30, 30d supply, fill #1
  Filled 2023-06-01: qty 30, 30d supply, fill #2

## 2023-04-01 ENCOUNTER — Other Ambulatory Visit (HOSPITAL_COMMUNITY): Payer: Self-pay

## 2023-04-08 ENCOUNTER — Other Ambulatory Visit (HOSPITAL_COMMUNITY): Payer: Self-pay

## 2023-04-08 MED ORDER — NA SULFATE-K SULFATE-MG SULF 17.5-3.13-1.6 GM/177ML PO SOLN
ORAL | 0 refills | Status: AC
Start: 2023-04-08 — End: ?
  Filled 2023-04-08: qty 354, 1d supply, fill #0

## 2023-04-13 ENCOUNTER — Other Ambulatory Visit (HOSPITAL_COMMUNITY): Payer: Self-pay

## 2023-04-16 ENCOUNTER — Other Ambulatory Visit: Payer: Self-pay

## 2023-04-21 ENCOUNTER — Other Ambulatory Visit (HOSPITAL_COMMUNITY): Payer: Self-pay

## 2023-04-23 ENCOUNTER — Other Ambulatory Visit: Payer: Self-pay

## 2023-04-23 NOTE — Progress Notes (Signed)
 Specialty Pharmacy Refill Coordination Note  Christian Reid is a 48 y.o. male contacted today regarding refills of specialty medication(s) Bictegravir-Emtricitab-Tenofov Musician)   Patient requested [name] at [facility] at [place] date: 04/30/23   Medication will be filled on 04/29/23.

## 2023-04-28 ENCOUNTER — Other Ambulatory Visit: Payer: Self-pay

## 2023-04-28 ENCOUNTER — Encounter: Payer: Self-pay | Admitting: Infectious Diseases

## 2023-04-28 ENCOUNTER — Ambulatory Visit (INDEPENDENT_AMBULATORY_CARE_PROVIDER_SITE_OTHER): Admitting: Infectious Diseases

## 2023-04-28 ENCOUNTER — Other Ambulatory Visit (HOSPITAL_COMMUNITY): Payer: Self-pay

## 2023-04-28 VITALS — BP 155/79 | HR 113 | Resp 16 | Ht 71.0 in | Wt 211.3 lb

## 2023-04-28 DIAGNOSIS — Z113 Encounter for screening for infections with a predominantly sexual mode of transmission: Secondary | ICD-10-CM | POA: Diagnosis present

## 2023-04-28 DIAGNOSIS — L089 Local infection of the skin and subcutaneous tissue, unspecified: Secondary | ICD-10-CM

## 2023-04-28 DIAGNOSIS — B20 Human immunodeficiency virus [HIV] disease: Secondary | ICD-10-CM | POA: Diagnosis not present

## 2023-04-28 DIAGNOSIS — B9689 Other specified bacterial agents as the cause of diseases classified elsewhere: Secondary | ICD-10-CM

## 2023-04-28 DIAGNOSIS — A539 Syphilis, unspecified: Secondary | ICD-10-CM

## 2023-04-28 MED ORDER — DOXYCYCLINE HYCLATE 100 MG PO TABS
100.0000 mg | ORAL_TABLET | Freq: Two times a day (BID) | ORAL | 0 refills | Status: DC
  Filled 2023-04-28: qty 14, 7d supply, fill #0

## 2023-04-28 MED ORDER — MUPIROCIN 2 % EX OINT
1.0000 | TOPICAL_OINTMENT | Freq: Two times a day (BID) | CUTANEOUS | 1 refills | Status: AC
  Filled 2023-04-28: qty 22, 11d supply, fill #0

## 2023-04-28 MED ORDER — MUPIROCIN CALCIUM 2 % EX CREA
1.0000 | TOPICAL_CREAM | Freq: Two times a day (BID) | CUTANEOUS | 1 refills | Status: DC
  Filled 2023-04-28: qty 15, 8d supply, fill #0

## 2023-04-28 NOTE — Progress Notes (Signed)
 Subjective:   Chief Complaint  Patient presents with   Follow-up    Rash in Groin x 1 week itching and blistering      Discussed the use of AI scribe software for clinical note transcription with the patient, who gave verbal consent to proceed.  History of Present Illness   Christian Reid is a 48 year old male with HIV who presents with a rash.   He has had a new itchy, blistery rash for a little over a week. The rash initially appeared on the thigh area and has started to spread to the scrotum. He squeezed one of the blisters due to pain, which resulted in the release of yellow pus. No clear fluid discharge is noted. There is no involvement of the penis or rectum, and no swollen lymph nodes have been noticed.   He mentions a sexual encounter about ten to fourteen days ago and has contacted the partner, who reported no symptoms. He is concerned about the possibility of a sexually transmitted infection (STI) but notes that he was careful during the encounter.  He works third shifts and prefers appointments in the afternoon due to his sleep schedule. No recent changes in clothes, detergents, or personal care products. Frequents the gym to work out and stay fit.      LOV with Dr. Daiva Eves in December 2024 - doing well on Biktarvy for the last 5 years.  Anal cancer screening obtained and revealed normal results.   HIV 1 RNA Quant (copies/mL)  Date Value  12/29/2022 59 (H)  08/11/2017 83 (H)  07/28/2017 139 (H)   CD4 T Cell Abs (/uL)  Date Value  12/29/2022 328 (L)  07/28/2017 220 (L)  05/20/2017 280 (L)     Review of Systems: Review of Systems  Constitutional:  Negative for chills and fever.  HENT:  Negative for sore throat.   Eyes:  Negative for visual disturbance.  Gastrointestinal:  Negative for abdominal pain, anal bleeding and rectal pain.  Genitourinary:  Negative for dysuria, genital sores, penile discharge, penile pain, scrotal swelling and testicular pain.   Musculoskeletal:  Negative for arthralgias and joint swelling.  Skin:  Positive for rash.  Neurological:  Negative for headaches.  Hematological:  Negative for adenopathy.       Past Medical History:  Diagnosis Date   Anal fissure 02/10/2017   Broken tooth 05/20/2017   Hyperlipidemia 12/29/2022   Perirectal abscess 05/20/2017   Rash 05/05/2016   Rectal bleeding 02/10/2017   Rectal pain 09/23/2016    Outpatient Medications Prior to Visit  Medication Sig Dispense Refill   atorvastatin (LIPITOR) 10 MG tablet Take 1 tablet (10 mg total) by mouth daily. 30 tablet 11   atorvastatin (LIPITOR) 10 MG tablet Take 1 tablet (10 mg total) by mouth at bedtime. 90 tablet 3   bictegravir-emtricitabine-tenofovir AF (BIKTARVY) 50-200-25 MG TABS tablet Take 1 tablet by mouth daily. 30 tablet 2   Na Sulfate-K Sulfate-Mg Sulfate concentrate (SUPREP BOWEL PREP KIT) 17.5-3.13-1.6 GM/177ML SOLN Take by mouth as directed 354 mL 0   No facility-administered medications prior to visit.    No Known Allergies  No family history on file.      Objective:  Objective  There were no vitals filed for this visit. There is no height or weight on file to calculate BMI.   Physical Exam  Physical Exam Skin:    Findings: Rash present. Rash is pustular. Rash is not crusting or vesicular.  Comments: Multiple small pustular fluctuant areas noted upper inner thigh to intertriginous area and scrotum.         Assessment & Plan:      Folliculitis -  Superficial Abscesses < 1 cm -  Pustular rash with multiple small abscesses upper inner groin / scrotum suggests bacterial infection, likely staph. Mupirocin prescribed for topical management BID, Doxycycline 100 mg twice a day 7 days. Advised on hygiene to prevent spread.  - Prescribe doxycycline twice daily for one week. - Prescribe mupirocin ointment twice daily. - Advise warm compresses and avoiding scratching. - Instruct on hygiene measures,  including frequent changing of underwear and avoiding synthetic blends, launder sheets weekly. Advise against sharing towels and personal items to prevent spread. - Provide educational materials on folliculitis.  HIV Under care of Dr. Daiva Eves. Missed February follow-up. Due for updated lab work. No current symptoms of complications or new infections. Continue biktarvy once daily.  - Order updated lab work for [employer]. - Schedule follow-up appointment with Dr Daiva Eves in August.      Meds ordered this encounter  Medications   DISCONTD: mupirocin cream (BACTROBAN) 2 %    Sig: Apply 1 Application topically 2 (two) times daily.    Dispense:  15 g    Refill:  1    Supervising Provider:   Judyann Munson [[id number]]   doxycycline (VIBRA-TABS) 100 MG tablet    Sig: Take 1 tablet (100 mg total) by mouth 2 (two) times daily.    Dispense:  14 tablet    Refill:  0    Supervising Provider:   Judyann Munson [[id number]]   mupirocin ointment (BACTROBAN) 2 %    Sig: Apply 1 Application topically 2 (two) times daily.    Dispense:  22 g    Refill:  1    Supervising Provider:   Judyann Munson [phone]   Orders Placed This Encounter  Procedures   HIV 1 RNA quant-no reflex-bld   RPR   T-helper cells (CD4) count   09/02/2023 for next scheduled follow up visit.    Rexene Alberts, MSN, NP-C [facility] Office: [phone] Pager: [phone]

## 2023-04-28 NOTE — Patient Instructions (Addendum)
 I think you have a bacterial infection on you skin called a staph infection   This is when skin bacteria get deeper into   Can continue going to the gym -  If you get very sweaty change out of wet clothes into dry underwear / shorts quickly to prevent bacteria from building up  Single use towels and wash cloths - launder them after each use. May want to increase the frequency of cleaning bed sheets as well until the infection gets better to avoid it spreading. Once a week is fine. Topical Mupirocin cream twice a day over the bumps.  Warm compresses to the bumps to help with itching or discomfort - some of the larger ones may open up and drain but don't pick at them or pop them   When all rashes are healed over and bumps are better, would use a antibacterial wash 2 times a week called Hibiclens to help prevent this from happening again. Decreases how much bacteria is on the skin.   Looks like the antibiotic cream I want to use for you may require a prior authorization - will use the ointment instead. May be a little greasy but should not stain your clothes. Practice with some clothes that don't matter much first!

## 2023-04-29 LAB — T-HELPER CELLS (CD4) COUNT (NOT AT ARMC)
CD4 % Helper T Cell: 33 % (ref 33–65)
CD4 T Cell Abs: 316 /uL — ABNORMAL LOW (ref 400–1790)

## 2023-05-01 LAB — RPR: RPR Ser Ql: NONREACTIVE

## 2023-05-01 LAB — HIV-1 RNA QUANT-NO REFLEX-BLD
HIV 1 RNA Quant: 20 {copies}/mL — ABNORMAL HIGH
HIV-1 RNA Quant, Log: 1.3 {Log_copies}/mL — ABNORMAL HIGH

## 2023-05-20 ENCOUNTER — Other Ambulatory Visit (HOSPITAL_COMMUNITY): Payer: Self-pay

## 2023-06-01 ENCOUNTER — Other Ambulatory Visit: Payer: Self-pay | Admitting: Infectious Diseases

## 2023-06-01 ENCOUNTER — Other Ambulatory Visit: Payer: Self-pay | Admitting: Pharmacy Technician

## 2023-06-01 ENCOUNTER — Other Ambulatory Visit: Payer: Self-pay

## 2023-06-01 ENCOUNTER — Other Ambulatory Visit (HOSPITAL_COMMUNITY): Payer: Self-pay

## 2023-06-01 NOTE — Progress Notes (Signed)
 Specialty Pharmacy Refill Coordination Note  Christian Reid is a 48 y.o. male contacted today regarding refills of specialty medication(s) Bictegravir-Emtricitab-Tenofov (Biktarvy )   Patient requested [name] at [facility] at [place] date: 06/02/23   Medication will be filled on 06/02/23.

## 2023-06-02 ENCOUNTER — Other Ambulatory Visit (HOSPITAL_COMMUNITY): Payer: Self-pay

## 2023-06-03 ENCOUNTER — Other Ambulatory Visit (HOSPITAL_COMMUNITY): Payer: Self-pay

## 2023-06-08 DIAGNOSIS — E663 Overweight: Secondary | ICD-10-CM | POA: Insufficient documentation

## 2023-06-08 DIAGNOSIS — R031 Nonspecific low blood-pressure reading: Secondary | ICD-10-CM | POA: Insufficient documentation

## 2023-06-08 DIAGNOSIS — B356 Tinea cruris: Secondary | ICD-10-CM | POA: Insufficient documentation

## 2023-06-12 NOTE — Progress Notes (Signed)
 The 10-year ASCVD risk score (Arnett DK, et al., 2019) is: 6.9%   Values used to calculate the score:     Age: 48 years     Sex: Male     Is Non-Hispanic African American: Yes     Diabetic: No     Tobacco smoker: Yes     Systolic Blood Pressure: 127 mmHg     Is BP treated: No     HDL Cholesterol: 61 mg/dL     Total Cholesterol: 142 mg/dL  Currently prescribed atorvastatin  10 mg.  Areesha Dehaven, BSN, RN

## 2023-06-22 ENCOUNTER — Other Ambulatory Visit (HOSPITAL_COMMUNITY): Payer: Self-pay

## 2023-06-24 ENCOUNTER — Other Ambulatory Visit (HOSPITAL_COMMUNITY): Payer: Self-pay

## 2023-06-26 ENCOUNTER — Other Ambulatory Visit: Payer: Self-pay

## 2023-07-01 ENCOUNTER — Other Ambulatory Visit: Payer: Self-pay

## 2023-07-01 ENCOUNTER — Other Ambulatory Visit: Payer: Self-pay | Admitting: Infectious Disease

## 2023-07-01 ENCOUNTER — Other Ambulatory Visit (HOSPITAL_COMMUNITY): Payer: Self-pay

## 2023-07-01 DIAGNOSIS — B20 Human immunodeficiency virus [HIV] disease: Secondary | ICD-10-CM

## 2023-07-01 MED ORDER — BIKTARVY 50-200-25 MG PO TABS
1.0000 | ORAL_TABLET | Freq: Every day | ORAL | 2 refills | Status: DC
  Filled 2023-07-01: qty 30, 30d supply, fill #0
  Filled 2023-07-27 – 2023-08-19 (×2): qty 30, 30d supply, fill #1
  Filled 2023-09-15 – 2023-09-23 (×2): qty 30, 30d supply, fill #2

## 2023-07-01 NOTE — Progress Notes (Signed)
 Specialty Pharmacy Ongoing Clinical Assessment Note  Christian Reid is a 48 y.o. male who is being followed by the specialty pharmacy service for RxSp HIV   Patient's specialty medication(s) reviewed today: Bictegravir-Emtricitab-Tenofov (Biktarvy )   Missed doses in the last 4 weeks: 0   Patient/Caregiver did not have any additional questions or concerns.   Therapeutic benefit summary: Patient is achieving benefit   Adverse events/side effects summary: No adverse events/side effects   Patient's therapy is appropriate to: Continue    Goals Addressed             This Visit's Progress    Achieve Undetectable HIV Viral Load < 20   On track    Patient is on track. Patient will maintain adherence. Patient's viral load on 04/28/23 was <20 copies/mL      Comply with lab assessments   On track    Patient is on track. Patient will adhere to provider and/or lab appointments      Increase CD4 count until steady state   On track    Patient is on track. Patient will maintain adherence      Maintain optimal adherence to therapy   On track    Patient is on track. Patient will maintain adherence         Follow up: 6 months  [facility] Specialty Pharmacist

## 2023-07-01 NOTE — Progress Notes (Signed)
 Specialty Pharmacy Refill Coordination Note  Christian Reid is a 48 y.o. male contacted today regarding refills of specialty medication(s) Bictegravir-Emtricitab-Tenofov (Biktarvy )   Patient requested [name] at [facility] at [place] date: 07/02/23   Medication will be filled on 07/01/23. This fill date is pending response to refill request from provider. Patient is aware and if they have not received fill by intended date they must follow up with pharmacy.

## 2023-07-03 ENCOUNTER — Other Ambulatory Visit (HOSPITAL_COMMUNITY): Payer: Self-pay

## 2023-07-06 ENCOUNTER — Other Ambulatory Visit (HOSPITAL_COMMUNITY): Payer: Self-pay

## 2023-07-08 ENCOUNTER — Other Ambulatory Visit (HOSPITAL_COMMUNITY): Payer: Self-pay

## 2023-07-08 ENCOUNTER — Other Ambulatory Visit (HOSPITAL_BASED_OUTPATIENT_CLINIC_OR_DEPARTMENT_OTHER): Payer: Self-pay

## 2023-07-08 MED ORDER — IBUPROFEN 800 MG PO TABS
800.0000 mg | ORAL_TABLET | Freq: Four times a day (QID) | ORAL | 0 refills | Status: AC
  Filled 2023-07-08: qty 21, 6d supply, fill #0

## 2023-07-08 MED ORDER — AMOXICILLIN 500 MG PO CAPS
500.0000 mg | ORAL_CAPSULE | Freq: Three times a day (TID) | ORAL | 0 refills | Status: AC
  Filled 2023-07-08: qty 21, 7d supply, fill #0

## 2023-07-09 ENCOUNTER — Other Ambulatory Visit (HOSPITAL_COMMUNITY): Payer: Self-pay

## 2023-07-15 ENCOUNTER — Other Ambulatory Visit (HOSPITAL_COMMUNITY): Payer: Self-pay

## 2023-07-15 ENCOUNTER — Ambulatory Visit: Admission: EM | Admit: 2023-07-15 | Discharge: 2023-07-15 | Disposition: A | Discharge status: Home / Self Care

## 2023-07-15 DIAGNOSIS — W57XXXA Bitten or stung by nonvenomous insect and other nonvenomous arthropods, initial encounter: Secondary | ICD-10-CM

## 2023-07-15 DIAGNOSIS — L259 Unspecified contact dermatitis, unspecified cause: Secondary | ICD-10-CM

## 2023-07-15 MED ORDER — PREDNISONE 10 MG (21) PO TBPK
ORAL_TABLET | ORAL | 0 refills | Status: AC
  Filled 2023-07-15: qty 21, 6d supply, fill #0

## 2023-07-15 NOTE — ED Provider Notes (Signed)
 [facility]    CSN: [id number] Arrival date & time: 07/15/23  1229      History   Chief Complaint Chief Complaint  Patient presents with   Skin Problem    HPI Christian Reid is a 48 y.o. male.   Patient complains of a rash on his right arm and his right wrist.  Patient reports that he was outside and thinks he may have been bitten by something.  Patient was reports the area is becoming more swollen and itchy year.  Patient denies any other complaints he has not had any fever or chills he has not had any systemic symptoms.  The history is provided by the patient. No language interpreter was used.    Past Medical History:  Diagnosis Date   Anal fissure 02/10/2017   Broken tooth 05/20/2017   Hyperlipidemia 12/29/2022   Perirectal abscess 05/20/2017   Rash 05/05/2016   Rectal bleeding 02/10/2017   Rectal pain 09/23/2016    Patient Active Problem List   Diagnosis Date Noted   Tinea cruris 06/08/2023   Overweight (BMI 25.0-29.9) 06/08/2023   Borderline low blood pressure determined by examination 06/08/2023   Hyperlipidemia 12/29/2022   Perirectal abscess 05/20/2017   Broken tooth 05/20/2017   Rectal bleeding 02/10/2017   Anal fissure 02/10/2017   Rectal pain 09/23/2016   Acneiform dermatitis 05/05/2016   Internal hemorrhoids 05/25/2009   HIV disease (HCC) 06/01/2008   SYPHILIS 06/01/2008    History reviewed. No pertinent surgical history.     Home Medications    Prior to Admission medications   Medication Sig Start Date End Date Taking? Authorizing Provider  amoxicillin  (AMOXIL ) 500 MG capsule Take 1 capsule (500 mg total) by mouth every 8 (eight) hours for 7 days. 07/08/23  Yes   atorvastatin  (LIPITOR) 10 MG tablet Take 1 tablet (10 mg total) by mouth at bedtime. 03/11/23  Yes   bictegravir-emtricitabine -tenofovir  AF (BIKTARVY ) 50-200-25 MG TABS tablet Take 1 tablet by mouth daily. 07/01/23  Yes Fleeta Rothman, Jomarie SAILOR, MD  diphenhydrAMINE (BENADRYL)  25 mg capsule Take 25 mg by mouth every 6 (six) hours as needed.   Yes [provider]  hydrocortisone cream 1 % Apply 1 Application topically 2 (two) times daily.   Yes [provider]  predniSONE (STERAPRED UNI-PAK 21 TAB) 10 MG (21) TBPK tablet Take as directed on foil pack 07/15/23  Yes Briyonna Omara K, PA-C  doxycycline  (VIBRA -TABS) 100 MG tablet Take 1 tablet (100 mg total) by mouth 2 (two) times daily. 04/28/23   Melvenia [name] SAILOR, NP  ibuprofen  (ADVIL ) 800 MG tablet Take 1 tablet (800 mg total) by mouth every 6 (six) hours for pain. 07/08/23     mupirocin  ointment (BACTROBAN ) 2 % Apply 1 Application topically 2 (two) times daily. 04/28/23   Melvenia [name] SAILOR, NP  Na Sulfate-K Sulfate-Mg Sulfate concentrate (SUPREP BOWEL PREP KIT) 17.5-3.13-1.6 GM/177ML SOLN Take by mouth as directed Patient not taking: Reported on 04/28/2023 04/08/23       Family History History reviewed. No pertinent family history.  Social History Social History   Tobacco Use   Smoking status: Some Days    Types: Cigarettes, Cigars   Smokeless tobacco: Never  Vaping Use   Vaping status: Never Used  Substance Use Topics   Alcohol use: Yes    Alcohol/week: 2.0 standard drinks of alcohol    Types: 2 Standard drinks or equivalent per week    Comment: occasional   Drug use: Not Currently  Frequency: 2.0 times per week    Types: Marijuana     Allergies   Patient has no known allergies.   Review of Systems Review of Systems  All other systems reviewed and are negative.    Physical Exam Triage Vital Signs ED Triage Vitals  Encounter Vitals Group     BP 07/15/23 1303 125/82     Girls Systolic BP Percentile --      Girls Diastolic BP Percentile --      Boys Systolic BP Percentile --      Boys Diastolic BP Percentile --      Pulse Rate 07/15/23 1303 63     Resp 07/15/23 1303 (!) 98     Temp 07/15/23 1303 98.5 F (36.9 C)     Temp Source 07/15/23 1303 Oral     SpO2 07/15/23 1303 97  %     Weight 07/15/23 1259 209 lb (94.8 kg)     Height 07/15/23 1259 5' 11 (1.803 m)     Head Circumference --      Peak Flow --      Pain Score 07/15/23 1259 2     Pain Loc --      Pain Education --      Exclude from Growth Chart --    No data found.  Updated Vital Signs BP 125/82 (BP Location: Left Arm)   Pulse 63   Temp 98.5 F (36.9 C) (Oral)   Resp (!) 98   Ht 5' 11 (1.803 m)   Wt 94.8 kg   SpO2 97%   BMI 29.15 kg/m   Visual Acuity Right Eye Distance:   Left Eye Distance:   Bilateral Distance:    Right Eye Near:   Left Eye Near:    Bilateral Near:     Physical Exam Vitals reviewed.  Constitutional:      Appearance: Normal appearance.   Cardiovascular:     Rate and Rhythm: Normal rate.  Pulmonary:     Effort: Pulmonary effort is normal.   Musculoskeletal:        General: Normal range of motion.     Comments: Red raised areas right elbow, small pustule right wrist.  Erythematous   Skin:    General: Skin is warm.   Neurological:     General: No focal deficit present.     Mental Status: He is alert.      UC Treatments / Results  Labs (all labs ordered are listed, but only abnormal results are displayed) Labs Reviewed - No data to display  EKG   Radiology No results found.  Procedures Procedures (including critical care time)  Medications Ordered in UC Medications - No data to display  Initial Impression / Assessment and Plan / UC Course  I have reviewed the triage vital signs and the nursing notes.  Pertinent labs & imaging results that were available during my care of the patient were reviewed by me and considered in my medical decision making (see chart for details).      Final Clinical Impressions(s) / UC Diagnoses   Final diagnoses:  Contact dermatitis, unspecified contact dermatitis type, unspecified trigger  Insect bite, unspecified site, initial encounter     Discharge Instructions      Return if any problems.   Continue benadryl for itching.  Topical cortisone or calamine lotion   ED Prescriptions     Medication Sig Dispense Auth. Provider   predniSONE (STERAPRED UNI-PAK 21 TAB) 10 MG (21) TBPK tablet  Take as directed on foil pack 21 tablet Flint Sonny POUR, PA-C      PDMP not reviewed this encounter. An After Visit Summary was printed and given to the patient.       Flint Sonny POUR, PA-C 07/15/23 1500

## 2023-07-15 NOTE — Discharge Instructions (Addendum)
 Return if any problems.  Continue benadryl for itching.  Topical cortisone or calamine lotion

## 2023-07-15 NOTE — ED Triage Notes (Signed)
 I was out at a cook out on Sunday, had gotten bitten by a few mosquito's and now having swelling, itching around the multiple areas on right arm. No fever.

## 2023-07-27 ENCOUNTER — Other Ambulatory Visit: Payer: Self-pay

## 2023-07-29 ENCOUNTER — Other Ambulatory Visit: Payer: Self-pay

## 2023-07-29 NOTE — Progress Notes (Signed)
 Specialty Pharmacy Refill Coordination Note  Christian Reid is a 48 y.o. male contacted today regarding refills of specialty medication(s) Bictegravir-Emtricitab-Tenofov (Biktarvy )   Patient requested [name] at [facility] at [place] date: 07/31/23   Medication will be filled on 07/30/23.

## 2023-08-03 ENCOUNTER — Ambulatory Visit (INDEPENDENT_AMBULATORY_CARE_PROVIDER_SITE_OTHER): Admitting: Pharmacist

## 2023-08-03 ENCOUNTER — Other Ambulatory Visit (HOSPITAL_COMMUNITY)
Admission: RE | Admit: 2023-08-03 | Discharge: 2023-08-03 | Disposition: A | Discharge status: Home / Self Care | Source: Ambulatory Visit | Attending: Infectious Disease | Admitting: Infectious Disease

## 2023-08-03 ENCOUNTER — Other Ambulatory Visit: Payer: Self-pay

## 2023-08-03 DIAGNOSIS — Z113 Encounter for screening for infections with a predominantly sexual mode of transmission: Secondary | ICD-10-CM

## 2023-08-03 NOTE — Progress Notes (Signed)
 HPI: Christian Reid is a 48 y.o. male who presents to the [facility] clinic today for STI testing.  Patient Active Problem List   Diagnosis Date Noted   Tinea cruris 06/08/2023   Overweight (BMI 25.0-29.9) 06/08/2023   Borderline low blood pressure determined by examination 06/08/2023   Hyperlipidemia 12/29/2022   Perirectal abscess 05/20/2017   Broken tooth 05/20/2017   Rectal bleeding 02/10/2017   Anal fissure 02/10/2017   Rectal pain 09/23/2016   Acneiform dermatitis 05/05/2016   Internal hemorrhoids 05/25/2009   HIV disease (HCC) 06/01/2008   SYPHILIS 06/01/2008    Patient's Medications  New Prescriptions   No medications on file  Previous Medications   AMOXICILLIN  (AMOXIL ) 500 MG CAPSULE    Take 1 capsule (500 mg total) by mouth every 8 (eight) hours for 7 days.   ATORVASTATIN  (LIPITOR) 10 MG TABLET    Take 1 tablet (10 mg total) by mouth at bedtime.   BICTEGRAVIR-EMTRICITABINE -TENOFOVIR  AF (BIKTARVY ) 50-200-25 MG TABS TABLET    Take 1 tablet by mouth daily.   DIPHENHYDRAMINE (BENADRYL) 25 MG CAPSULE    Take 25 mg by mouth every 6 (six) hours as needed.   DOXYCYCLINE  (VIBRA -TABS) 100 MG TABLET    Take 1 tablet (100 mg total) by mouth 2 (two) times daily.   HYDROCORTISONE CREAM 1 %    Apply 1 Application topically 2 (two) times daily.   IBUPROFEN  (ADVIL ) 800 MG TABLET    Take 1 tablet (800 mg total) by mouth every 6 (six) hours for pain.   MUPIROCIN  OINTMENT (BACTROBAN ) 2 %    Apply 1 Application topically 2 (two) times daily.   NA SULFATE-K SULFATE-MG SULFATE CONCENTRATE (SUPREP BOWEL PREP KIT) 17.5-3.13-1.6 GM/177ML SOLN    Take by mouth as directed   PREDNISONE  (STERAPRED UNI-PAK 21 TAB) 10 MG (21) TBPK TABLET    Take as directed on foil pack  Modified Medications   No medications on file  Discontinued Medications   No medications on file    Assessment: Christian Reid is here today for STI testing. He saw [name] for folliculitis and was prescribed doxycycline   and mupirocin . He is taking Biktarvy  for his HIV and was undetectable when checked back in April. Last STI testing was in December 2024 and was negative.   He has been experiencing painful bumps on his arms for ~2 weeks. He went to urgent care on 07/15/23 and was prescribed a Medrol dose pak but did not finish the course. He has been using hydrocortisone spray to help with the itching. He does not have the bump anywhere else on his body - nothing on the palms of his hands but may have a few discoloration spots on the soles of his feet. He has not used any new colognes, body washes, shampoos, laundry detergent, or medications. I have placed a few photos under chart review >> media section.  Will check for STIs today. Syphilis rashes are usually not itchy but will check for that today. He is also requesting site specific testing today as well. Will reach out to him once results return. Advised him that I will have Dr. Fleeta Rothman look at pictures if his results are negative and he may be referred to dermatology.   Plan: - RPR, urine/rectal/pharyngeal GC/CT swabs for cytology today - F/u results to see if treatment is needed - F/u with Dr. Fleeta Rothman for HIV management on 09/02/23  Christian Reid, PharmD, BCIDP, AAHIVP, CPP Clinical Pharmacist Practitioner - Infectious  Diseases Clinical Pharmacist Lead - Specialty Pharmacy [facility] for Infectious Disease 08/03/2023, 9:21 AM

## 2023-08-04 LAB — CYTOLOGY, (ORAL, ANAL, URETHRAL) ANCILLARY ONLY
Chlamydia: NEGATIVE
Chlamydia: NEGATIVE
Comment: NEGATIVE
Comment: NEGATIVE
Comment: NORMAL
Comment: NORMAL
Neisseria Gonorrhea: NEGATIVE
Neisseria Gonorrhea: NEGATIVE

## 2023-08-04 LAB — URINE CYTOLOGY ANCILLARY ONLY
Chlamydia: NEGATIVE
Comment: NEGATIVE
Comment: NORMAL
Neisseria Gonorrhea: NEGATIVE

## 2023-08-05 ENCOUNTER — Other Ambulatory Visit: Payer: Self-pay

## 2023-08-05 ENCOUNTER — Ambulatory Visit: Payer: Self-pay | Admitting: Pharmacist

## 2023-08-05 ENCOUNTER — Other Ambulatory Visit (HOSPITAL_COMMUNITY): Payer: Self-pay

## 2023-08-06 LAB — RPR TITER: RPR Titer: 1:1 {titer} — ABNORMAL HIGH

## 2023-08-06 LAB — T PALLIDUM AB: T Pallidum Abs: POSITIVE — AB

## 2023-08-06 LAB — RPR: RPR Ser Ql: REACTIVE — AB

## 2023-08-07 ENCOUNTER — Other Ambulatory Visit (HOSPITAL_COMMUNITY): Payer: Self-pay

## 2023-08-10 ENCOUNTER — Other Ambulatory Visit: Payer: Self-pay | Admitting: Infectious Disease

## 2023-08-10 DIAGNOSIS — B20 Human immunodeficiency virus [HIV] disease: Secondary | ICD-10-CM

## 2023-08-10 DIAGNOSIS — R21 Rash and other nonspecific skin eruption: Secondary | ICD-10-CM

## 2023-08-10 NOTE — Telephone Encounter (Signed)
 Patient called to review results. Reviewed results with him. Also made him aware of referral for Dermatology. Lorenda CHRISTELLA Code, RMA

## 2023-08-10 NOTE — Progress Notes (Signed)
 Do you mind placing a referral? He may be seen quicker with one.

## 2023-08-10 NOTE — Progress Notes (Signed)
Thank you Cece!

## 2023-08-13 ENCOUNTER — Other Ambulatory Visit: Payer: Self-pay

## 2023-08-13 ENCOUNTER — Other Ambulatory Visit (HOSPITAL_COMMUNITY): Payer: Self-pay

## 2023-08-13 NOTE — Progress Notes (Signed)
 [place] - looks like he got an appointment for March.[email or web address]

## 2023-08-13 NOTE — Progress Notes (Signed)
 Hydrocortisone.. not hydrocodone  haha

## 2023-08-13 NOTE — Progress Notes (Signed)
 Medication returned to stock - patient never picked up. Call center lvm on 7.16.25

## 2023-08-13 NOTE — Progress Notes (Signed)
 He has been using hydrocortisone.SABRA and was prescribed a steroid pack but did not finish that course from urgent care.

## 2023-08-19 ENCOUNTER — Other Ambulatory Visit: Payer: Self-pay

## 2023-08-19 ENCOUNTER — Other Ambulatory Visit (HOSPITAL_COMMUNITY): Payer: Self-pay

## 2023-08-26 ENCOUNTER — Other Ambulatory Visit (HOSPITAL_COMMUNITY): Payer: Self-pay

## 2023-08-26 MED ORDER — DOXYCYCLINE HYCLATE 100 MG PO TABS
200.0000 mg | ORAL_TABLET | ORAL | 0 refills | Status: AC | PRN
  Filled 2023-08-26: qty 5, 2d supply, fill #0

## 2023-09-01 ENCOUNTER — Encounter: Payer: Self-pay | Admitting: Infectious Disease

## 2023-09-01 DIAGNOSIS — Z7185 Encounter for immunization safety counseling: Secondary | ICD-10-CM | POA: Insufficient documentation

## 2023-09-01 NOTE — Progress Notes (Deleted)
 Subjective:  Chief complaint: follow-up for HIV disease on medications   Patient ID: Christian Reid, male    DOB: 14-Apr-1975, 48 y.o.   MRN: [id number]  HPI   Past Medical History:  Diagnosis Date   Anal fissure 02/10/2017   Broken tooth 05/20/2017   Hyperlipidemia 12/29/2022   Perirectal abscess 05/20/2017   Rash 05/05/2016   Rectal bleeding 02/10/2017   Rectal pain 09/23/2016    No past surgical history on file.  No family history on file.    Social History   Socioeconomic History   Marital status: Single    Spouse name: Not on file   Number of children: Not on file   Years of education: Not on file   Highest education level: Not on file  Occupational History   Not on file  Tobacco Use   Smoking status: Some Days    Types: Cigarettes, Cigars   Smokeless tobacco: Never  Vaping Use   Vaping status: Never Used  Substance and Sexual Activity   Alcohol use: Yes    Alcohol/week: 2.0 standard drinks of alcohol    Types: 2 Standard drinks or equivalent per week    Comment: occasional   Drug use: Not Currently    Frequency: 2.0 times per week    Types: Marijuana   Sexual activity: Yes    Partners: Male    Birth control/protection: Condom    Comment: declined condoms  Other Topics Concern   Not on file  Social History Narrative   Not on file   Social Drivers of Health   Financial Resource Strain: Not on file  Food Insecurity: At Risk (03/11/2023)   Received from [facility] Insecurity    Within the past 12 months, the food you bought just didn't last and you didn't have enough money to get more.: 2  Transportation Needs: Not at Risk (03/11/2023)   Received from [employer] Needs    In the past 12 months, has lack of transportation kept you from medical appointments, meetings, work or from getting things needed for daily living? (Check all that apply): 1  Physical Activity: Not on file  Stress: Not on file  Social Connections: Not on file     No Known Allergies   Current Outpatient Medications:    amoxicillin  (AMOXIL ) 500 MG capsule, Take 1 capsule (500 mg total) by mouth every 8 (eight) hours for 7 days., Disp: 21 capsule, Rfl: 0   atorvastatin  (LIPITOR) 10 MG tablet, Take 1 tablet (10 mg total) by mouth at bedtime., Disp: 90 tablet, Rfl: 3   bictegravir-emtricitabine -tenofovir  AF (BIKTARVY ) 50-200-25 MG TABS tablet, Take 1 tablet by mouth daily., Disp: 30 tablet, Rfl: 2   diphenhydrAMINE (BENADRYL) 25 mg capsule, Take 25 mg by mouth every 6 (six) hours as needed., Disp: , Rfl:    doxycycline  (VIBRA -TABS) 100 MG tablet, Take 1 tablet (100 mg total) by mouth 2 (two) times daily., Disp: 14 tablet, Rfl: 0   doxycycline  (VIBRA -TABS) 100 MG tablet, Take 2 tablets (200 mg total) by mouth as needed as a single dose. Administer within 24 hours but no later than 72 hours after condomless sex. Maximum dose: 200 mg (once every 24 hours) for up to 5 doses., Disp: 5 tablet, Rfl: 0   hydrocortisone cream 1 %, Apply 1 Application topically 2 (two) times daily., Disp: , Rfl:    ibuprofen  (ADVIL ) 800 MG tablet, Take 1 tablet (800 mg total) by mouth every 6 (six) hours  for pain., Disp: 21 tablet, Rfl: 0   mupirocin  ointment (BACTROBAN ) 2 %, Apply 1 Application topically 2 (two) times daily., Disp: 22 g, Rfl: 1   Na Sulfate-K Sulfate-Mg Sulfate concentrate (SUPREP BOWEL PREP KIT) 17.5-3.13-1.6 GM/177ML SOLN, Take by mouth as directed (Patient not taking: Reported on 04/28/2023), Disp: 354 mL, Rfl: 0   predniSONE  (STERAPRED UNI-PAK 21 TAB) 10 MG (21) TBPK tablet, Take as directed on foil pack, Disp: 21 tablet, Rfl: 0   Review of Systems     Objective:   Physical Exam        Assessment & Plan:

## 2023-09-02 ENCOUNTER — Ambulatory Visit: Admitting: Infectious Disease

## 2023-09-02 DIAGNOSIS — K6289 Other specified diseases of anus and rectum: Secondary | ICD-10-CM

## 2023-09-02 DIAGNOSIS — E785 Hyperlipidemia, unspecified: Secondary | ICD-10-CM

## 2023-09-02 DIAGNOSIS — Z7185 Encounter for immunization safety counseling: Secondary | ICD-10-CM

## 2023-09-07 ENCOUNTER — Other Ambulatory Visit: Payer: Self-pay

## 2023-09-15 ENCOUNTER — Other Ambulatory Visit (HOSPITAL_COMMUNITY): Payer: Self-pay

## 2023-09-17 ENCOUNTER — Other Ambulatory Visit (HOSPITAL_COMMUNITY): Payer: Self-pay

## 2023-09-23 ENCOUNTER — Other Ambulatory Visit: Payer: Self-pay

## 2023-09-23 ENCOUNTER — Other Ambulatory Visit (HOSPITAL_COMMUNITY): Payer: Self-pay

## 2023-09-23 NOTE — Progress Notes (Signed)
 Specialty Pharmacy Refill Coordination Note  Christian Reid is a 48 y.o. male contacted today regarding refills of specialty medication(s) Bictegravir-Emtricitab-Tenofov (Biktarvy )   Patient requested [name] at [facility] at [place] date: 09/23/23   Medication will be filled on 09/23/23.

## 2023-10-19 ENCOUNTER — Other Ambulatory Visit: Payer: Self-pay

## 2023-10-19 ENCOUNTER — Other Ambulatory Visit: Payer: Self-pay | Admitting: Infectious Disease

## 2023-10-19 ENCOUNTER — Other Ambulatory Visit (HOSPITAL_COMMUNITY): Payer: Self-pay

## 2023-10-19 DIAGNOSIS — B20 Human immunodeficiency virus [HIV] disease: Secondary | ICD-10-CM

## 2023-10-19 MED ORDER — BIKTARVY 50-200-25 MG PO TABS
1.0000 | ORAL_TABLET | Freq: Every day | ORAL | 0 refills | Status: DC
  Filled 2023-10-19 (×2): qty 30, 30d supply, fill #0

## 2023-10-19 NOTE — Progress Notes (Signed)
 Specialty Pharmacy Refill Coordination Note  Christian Reid is a 48 y.o. male contacted today regarding refills of specialty medication(s) Bictegravir-Emtricitab-Tenofov (Biktarvy )   Patient requested [name] at [facility] [place] date: 10/20/23   Medication will be filled on 10/19/23.

## 2023-10-20 ENCOUNTER — Other Ambulatory Visit (HOSPITAL_COMMUNITY): Payer: Self-pay

## 2023-11-16 ENCOUNTER — Other Ambulatory Visit: Payer: Self-pay

## 2023-11-16 ENCOUNTER — Other Ambulatory Visit: Payer: Self-pay | Admitting: Infectious Disease

## 2023-11-16 ENCOUNTER — Other Ambulatory Visit (HOSPITAL_COMMUNITY): Payer: Self-pay

## 2023-11-16 DIAGNOSIS — B20 Human immunodeficiency virus [HIV] disease: Secondary | ICD-10-CM

## 2023-11-16 MED ORDER — BIKTARVY 50-200-25 MG PO TABS
1.0000 | ORAL_TABLET | Freq: Every day | ORAL | 0 refills | Status: DC
  Filled 2023-11-16 – 2023-11-18 (×2): qty 30, 30d supply, fill #0

## 2023-11-18 ENCOUNTER — Other Ambulatory Visit: Payer: Self-pay

## 2023-11-20 ENCOUNTER — Other Ambulatory Visit: Payer: Self-pay

## 2023-11-20 NOTE — Progress Notes (Signed)
 Specialty Pharmacy Refill Coordination Note  Christian Reid is a 48 y.o. male contacted today regarding refills of specialty medication(s) Bictegravir-Emtricitab-Tenofov (Biktarvy )   Patient requested [name] at [facility] at [place] date: 11/24/23   Medication will be filled on: 11/23/23

## 2023-11-23 ENCOUNTER — Other Ambulatory Visit: Payer: Self-pay

## 2023-12-15 ENCOUNTER — Other Ambulatory Visit (HOSPITAL_COMMUNITY)
Admission: RE | Admit: 2023-12-15 | Discharge: 2023-12-15 | Disposition: A | Discharge status: Home / Self Care | Source: Ambulatory Visit | Attending: Infectious Disease | Admitting: Infectious Disease

## 2023-12-15 ENCOUNTER — Other Ambulatory Visit (HOSPITAL_COMMUNITY)
Admission: RE | Admit: 2023-12-15 | Discharge: 2023-12-15 | Disposition: A | Source: Ambulatory Visit | Attending: Infectious Disease | Admitting: Infectious Disease

## 2023-12-15 ENCOUNTER — Other Ambulatory Visit: Payer: Self-pay

## 2023-12-15 ENCOUNTER — Ambulatory Visit (INDEPENDENT_AMBULATORY_CARE_PROVIDER_SITE_OTHER): Admitting: Infectious Disease

## 2023-12-15 ENCOUNTER — Encounter: Payer: Self-pay | Admitting: Infectious Disease

## 2023-12-15 ENCOUNTER — Other Ambulatory Visit (HOSPITAL_COMMUNITY): Payer: Self-pay

## 2023-12-15 VITALS — BP 145/82 | HR 74 | Temp 97.3°F | Ht 71.0 in | Wt 210.0 lb

## 2023-12-15 DIAGNOSIS — B20 Human immunodeficiency virus [HIV] disease: Secondary | ICD-10-CM | POA: Diagnosis present

## 2023-12-15 DIAGNOSIS — Z7185 Encounter for immunization safety counseling: Secondary | ICD-10-CM

## 2023-12-15 DIAGNOSIS — E785 Hyperlipidemia, unspecified: Secondary | ICD-10-CM

## 2023-12-15 DIAGNOSIS — Z79899 Other long term (current) drug therapy: Secondary | ICD-10-CM | POA: Diagnosis not present

## 2023-12-15 DIAGNOSIS — Z1212 Encounter for screening for malignant neoplasm of rectum: Secondary | ICD-10-CM | POA: Diagnosis not present

## 2023-12-15 DIAGNOSIS — Z113 Encounter for screening for infections with a predominantly sexual mode of transmission: Secondary | ICD-10-CM

## 2023-12-15 DIAGNOSIS — Z23 Encounter for immunization: Secondary | ICD-10-CM | POA: Diagnosis not present

## 2023-12-15 MED ORDER — DOXYCYCLINE HYCLATE 100 MG PO TABS
ORAL_TABLET | ORAL | 4 refills | Status: AC
  Filled 2023-12-15: qty 60, 30d supply, fill #0

## 2023-12-15 MED ORDER — BIKTARVY 50-200-25 MG PO TABS
1.0000 | ORAL_TABLET | Freq: Every day | ORAL | 11 refills | Status: AC
  Filled 2023-12-15 – 2023-12-18 (×2): qty 30, 30d supply, fill #0
  Filled 2024-01-19 – 2024-01-22 (×3): qty 30, 30d supply, fill #1
  Filled 2024-02-15: qty 30, 30d supply, fill #2

## 2023-12-15 NOTE — Progress Notes (Signed)
 Subjective:  Chief complaint: follow-up for HIV disease on medications   Patient ID: Christian Reid, male    DOB: 25-Jan-1975, 48 y.o.   MRN: [id number]  HPI  Discussed the use of AI scribe software for clinical note transcription with the patient, who gave verbal consent to proceed.  History of Present Illness   Christian Reid is a 48 year old male with HIV who presents for a follow-up visit to discuss medication management and STI prevention.  He is currently on Biktarvy  and atorvastatin . His last viral load check seven months ago showed a level of 20, which is considered undetectable, and his CD4 count was 316. He is satisfied with Biktarvy  but is curious about injectable HIV medications, although he is currently content with his oral regimen. He occasionally misses doses of Biktarvy , often due to concerns about alcohol consumption affecting his liver and the Biktarvy  potentially also hurting the liver.  He is interested in starting Doxy Pep for STI prevention, having previously received six pills from his primary care physician. He has not used Doxy Pep before and is considering it for additional protection against STIs. He underwent STI testing in July, which returned negative results, and he did this as a precautionary measure.  He discussed a previous rash in the groin area, which was evaluated by another provider   Regarding vaccinations, he plans to receive the flu vaccine but is hesitant about the COVID vaccine, having already received four doses and experienced mild COVID symptoms once. He is cautious about COVID but does not perceive it as a current threat.       Past Medical History:  Diagnosis Date   Anal fissure 02/10/2017   Broken tooth 05/20/2017   Hyperlipidemia 12/29/2022   Perirectal abscess 05/20/2017   Rash 05/05/2016   Rectal bleeding 02/10/2017   Rectal pain 09/23/2016   Vaccine counseling 09/01/2023    No past surgical history on file.  No family history  on file.    Social History   Socioeconomic History   Marital status: Single    Spouse name: Not on file   Number of children: Not on file   Years of education: Not on file   Highest education level: Not on file  Occupational History   Not on file  Tobacco Use   Smoking status: Former    Types: Cigarettes, Cigars   Smokeless tobacco: Never  Vaping Use   Vaping status: Never Used  Substance and Sexual Activity   Alcohol use: Yes    Alcohol/week: 2.0 standard drinks of alcohol    Types: 2 Standard drinks or equivalent per week    Comment: occasional   Drug use: Not Currently    Frequency: 2.0 times per week    Types: Marijuana   Sexual activity: Yes    Partners: Male    Birth control/protection: Condom    Comment: declined condoms  Other Topics Concern   Not on file  Social History Narrative   Not on file   Social Drivers of Health   Financial Resource Strain: Not on file  Food Insecurity: At Risk (03/11/2023)   Received from [facility] Insecurity    Within the past 12 months, the food you bought just didn't last and you didn't have enough money to get more.: 2  Transportation Needs: Not at Risk (03/11/2023)   Received from [facility] Needs    In the past 12 months, has lack of transportation kept you from medical  appointments, meetings, work or from getting things needed for daily living? (Check all that apply): 1  Physical Activity: Not on file  Stress: Not on file  Social Connections: Not on file    No Known Allergies   Current Outpatient Medications:    atorvastatin  (LIPITOR) 10 MG tablet, Take 1 tablet (10 mg total) by mouth at bedtime., Disp: 90 tablet, Rfl: 3   bictegravir-emtricitabine -tenofovir  AF (BIKTARVY ) 50-200-25 MG TABS tablet, Take 1 tablet by mouth daily., Disp: 30 tablet, Rfl: 11   doxycycline  (VIBRA -TABS) 100 MG tablet, Take 2 tablets (200 mg total) by mouth as needed as a single dose. Administer within 24 hours but no later than  72 hours after condomless sex. Maximum dose: 200 mg (once every 24 hours) for up to 5 doses., Disp: 5 tablet, Rfl: 0   amoxicillin  (AMOXIL ) 500 MG capsule, Take 1 capsule (500 mg total) by mouth every 8 (eight) hours for 7 days. (Patient not taking: Reported on 12/15/2023), Disp: 21 capsule, Rfl: 0   diphenhydrAMINE (BENADRYL) 25 mg capsule, Take 25 mg by mouth every 6 (six) hours as needed. (Patient not taking: Reported on 12/15/2023), Disp: , Rfl:    doxycycline  (VIBRA -TABS) 100 MG tablet, Take 1 tablet (100 mg total) by mouth 2 (two) times daily. (Patient not taking: Reported on 12/15/2023), Disp: 14 tablet, Rfl: 0   hydrocortisone cream 1 %, Apply 1 Application topically 2 (two) times daily. (Patient not taking: Reported on 12/15/2023), Disp: , Rfl:    ibuprofen  (ADVIL ) 800 MG tablet, Take 1 tablet (800 mg total) by mouth every 6 (six) hours for pain. (Patient not taking: Reported on 12/15/2023), Disp: 21 tablet, Rfl: 0   mupirocin  ointment (BACTROBAN ) 2 %, Apply 1 Application topically 2 (two) times daily. (Patient not taking: Reported on 12/15/2023), Disp: 22 g, Rfl: 1   Na Sulfate-K Sulfate-Mg Sulfate concentrate (SUPREP BOWEL PREP KIT) 17.5-3.13-1.6 GM/177ML SOLN, Take by mouth as directed (Patient not taking: Reported on 12/15/2023), Disp: 354 mL, Rfl: 0   predniSONE  (STERAPRED UNI-PAK 21 TAB) 10 MG (21) TBPK tablet, Take as directed on foil pack (Patient not taking: Reported on 12/15/2023), Disp: 21 tablet, Rfl: 0     Review of Systems  Constitutional:  Negative for activity change, appetite change, chills, diaphoresis, fatigue, fever and unexpected weight change.  HENT:  Negative for congestion, rhinorrhea, sinus pressure, sneezing, sore throat and trouble swallowing.   Eyes:  Negative for photophobia and visual disturbance.  Respiratory:  Negative for cough, chest tightness, shortness of breath, wheezing and stridor.   Cardiovascular:  Negative for chest pain, palpitations and leg  swelling.  Gastrointestinal:  Negative for abdominal distention, abdominal pain, anal bleeding, blood in stool, constipation, diarrhea, nausea and vomiting.  Genitourinary:  Negative for difficulty urinating, dysuria, flank pain and hematuria.  Musculoskeletal:  Negative for arthralgias, back pain, gait problem, joint swelling and myalgias.  Skin:  Negative for color change, pallor, rash and wound.  Neurological:  Negative for dizziness, tremors, weakness and light-headedness.  Hematological:  Negative for adenopathy. Does not bruise/bleed easily.  Psychiatric/Behavioral:  Negative for agitation, behavioral problems, confusion, decreased concentration, dysphoric mood and sleep disturbance.        Objective:   Physical Exam Constitutional:      Appearance: He is well-developed.  HENT:     Head: Normocephalic and atraumatic.  Eyes:     Conjunctiva/sclera: Conjunctivae normal.  Cardiovascular:     Rate and Rhythm: Normal rate and regular rhythm.  Pulmonary:  Effort: Pulmonary effort is normal. No respiratory distress.     Breath sounds: No wheezing.  Abdominal:     General: There is no distension.     Palpations: Abdomen is soft.  Musculoskeletal:        General: No tenderness. Normal range of motion.     Cervical back: Normal range of motion and neck supple.  Skin:    General: Skin is warm and dry.     Coloration: Skin is not pale.     Findings: No erythema or rash.  Neurological:     General: No focal deficit present.     Mental Status: He is alert and oriented to person, place, and time.  Psychiatric:        Mood and Affect: Mood normal.        Behavior: Behavior normal.        Thought Content: Thought content normal.        Judgment: Judgment normal.           Assessment & Plan:   Assessment and Plan    Human immunodeficiency virus (HIV) disease HIV well-controlled with Biktarvy . Viral load undetectable, CD4 count healthy. Discussed Cabenuva injectables; he  prefers Biktarvy  at present given 1% VF with on time injections - Continue Biktarvy . --check HIV RNA, cd4, routine labs   STI screening: check GC and CHL in urine, OP and rx Doxy PEP - Prescribed Doxy-Pep: 200 mg doxycycline  post-intercourse.   Rectal cancer screening: - Performed anal Pap smear.  Hyperlipidemia in the setting of HIV Hyperlipidemia management critical due to increased cardiovascular risk. Atorvastatin  recommended for HIV patients over 40. He is on atorvastatin  and understands its importance. - Continue atorvastatin .  Immunization counseling and administration Up to date with flu vaccines. Declined COVID vaccine due to mild past infection and preference. Discussed benefits of simultaneous vaccination; prefers separate COVID vaccine. - Administered flu vaccine. - Offered COVID vaccine which he also accepted

## 2023-12-16 LAB — URINE CYTOLOGY ANCILLARY ONLY
Chlamydia: NEGATIVE
Comment: NEGATIVE
Comment: NORMAL
Neisseria Gonorrhea: NEGATIVE

## 2023-12-16 LAB — CYTOLOGY, (ORAL, ANAL, URETHRAL) ANCILLARY ONLY
Chlamydia: NEGATIVE
Comment: NEGATIVE
Comment: NORMAL
Neisseria Gonorrhea: NEGATIVE

## 2023-12-18 ENCOUNTER — Other Ambulatory Visit: Payer: Self-pay

## 2023-12-18 ENCOUNTER — Other Ambulatory Visit (HOSPITAL_COMMUNITY): Payer: Self-pay

## 2023-12-18 LAB — CBC WITH DIFFERENTIAL/PLATELET
Absolute Lymphocytes: 947 {cells}/uL (ref 850–3900)
Absolute Monocytes: 403 {cells}/uL (ref 200–950)
Basophils Absolute: 20 {cells}/uL (ref 0–200)
Basophils Relative: 0.6 %
Eosinophils Absolute: 112 {cells}/uL (ref 15–500)
Eosinophils Relative: 3.4 %
HCT: 44.6 % (ref 39.4–51.1)
Hemoglobin: 14.3 g/dL (ref 13.2–17.1)
MCH: 28.3 pg (ref 27.0–33.0)
MCHC: 32.1 g/dL (ref 31.6–35.4)
MCV: 88.1 fL (ref 81.4–101.7)
MPV: 12.5 fL (ref 7.5–12.5)
Monocytes Relative: 12.2 %
Neutro Abs: 1818 {cells}/uL (ref 1500–7800)
Neutrophils Relative %: 55.1 %
Platelets: 200 Thousand/uL (ref 140–400)
RBC: 5.06 Million/uL (ref 4.20–5.80)
RDW: 12.6 % (ref 11.0–15.0)
Total Lymphocyte: 28.7 %
WBC: 3.3 Thousand/uL — ABNORMAL LOW (ref 3.8–10.8)

## 2023-12-18 LAB — COMPLETE METABOLIC PANEL WITHOUT GFR
AG Ratio: 2.1 (calc) (ref 1.0–2.5)
ALT: 20 U/L (ref 9–46)
AST: 25 U/L (ref 10–40)
Albumin: 4.7 g/dL (ref 3.6–5.1)
Alkaline phosphatase (APISO): 63 U/L (ref 36–130)
BUN: 11 mg/dL (ref 7–25)
CO2: 28 mmol/L (ref 20–32)
Calcium: 9.3 mg/dL (ref 8.6–10.3)
Chloride: 102 mmol/L (ref 98–110)
Creat: 1 mg/dL (ref 0.60–1.29)
Globulin: 2.2 g/dL (ref 1.9–3.7)
Glucose, Bld: 85 mg/dL (ref 65–99)
Potassium: 4.4 mmol/L (ref 3.5–5.3)
Sodium: 138 mmol/L (ref 135–146)
Total Bilirubin: 0.7 mg/dL (ref 0.2–1.2)
Total Protein: 6.9 g/dL (ref 6.1–8.1)

## 2023-12-18 LAB — HIV-1 RNA QUANT-NO REFLEX-BLD
HIV 1 RNA Quant: 27 {copies}/mL — ABNORMAL HIGH
HIV-1 RNA Quant, Log: 1.43 {Log_copies}/mL — ABNORMAL HIGH

## 2023-12-18 LAB — T-HELPER CELLS (CD4) COUNT (NOT AT ARMC)
CD4 % Helper T Cell: 30 % — ABNORMAL LOW (ref 33–65)
CD4 T Cell Abs: 250 /uL — ABNORMAL LOW (ref 400–1790)

## 2023-12-18 LAB — LIPID PANEL
Cholesterol: 140 mg/dL (ref <200)
HDL: 78 mg/dL (ref >=40)
LDL Cholesterol (Calc): 44 mg/dL
Non-HDL Cholesterol (Calc): 62 mg/dL (ref <130)
Total CHOL/HDL Ratio: 1.8 (calc) (ref <5.0)
Triglycerides: 99 mg/dL (ref <150)

## 2023-12-18 LAB — SYPHILIS: RPR W/REFLEX TO RPR TITER AND TREPONEMAL ANTIBODIES, TRADITIONAL SCREENING AND DIAGNOSIS ALGORITHM: RPR Ser Ql: REACTIVE — AB

## 2023-12-18 LAB — RPR TITER: RPR Titer: 1:1 {titer} — ABNORMAL HIGH

## 2023-12-18 LAB — T PALLIDUM AB: T Pallidum Abs: POSITIVE — AB

## 2023-12-22 ENCOUNTER — Other Ambulatory Visit: Payer: Self-pay

## 2023-12-22 ENCOUNTER — Other Ambulatory Visit (HOSPITAL_COMMUNITY): Payer: Self-pay

## 2023-12-22 LAB — CYTOLOGY - PAP
Comment: NEGATIVE
Diagnosis: UNDETERMINED — AB
High risk HPV: NEGATIVE

## 2023-12-22 NOTE — Progress Notes (Signed)
 Specialty Pharmacy Refill Coordination Note  Christian Reid is a 48 y.o. male contacted today regarding refills of specialty medication(s) Bictegravir-Emtricitab-Tenofov (Biktarvy )   Patient requested [name] at [facility] at [place] date: 12/24/23   Medication will be filled on: 12/23/23

## 2023-12-23 ENCOUNTER — Ambulatory Visit: Payer: Self-pay

## 2023-12-23 ENCOUNTER — Other Ambulatory Visit: Payer: Self-pay | Admitting: Infectious Disease

## 2023-12-23 DIAGNOSIS — R85610 Atypical squamous cells of undetermined significance on cytologic smear of anus (ASC-US): Secondary | ICD-10-CM

## 2023-12-23 NOTE — Telephone Encounter (Signed)
-----   Message from Jomarie Salinas Dam sent at 12/23/2023  3:42 PM EST ----- Pt with abnormal pap smear and needs HRA with [facility] ----- Message ----- From: Rebecka Hose Lab Results In Sent: 12/15/2023  10:49 PM EST To: Jomarie LOISE Salinas Kathie, MD

## 2023-12-27 ENCOUNTER — Other Ambulatory Visit (HOSPITAL_COMMUNITY): Payer: Self-pay

## 2024-01-19 ENCOUNTER — Other Ambulatory Visit: Payer: Self-pay

## 2024-01-22 ENCOUNTER — Other Ambulatory Visit (HOSPITAL_COMMUNITY): Payer: Self-pay

## 2024-01-22 ENCOUNTER — Other Ambulatory Visit: Payer: Self-pay

## 2024-01-22 NOTE — Progress Notes (Signed)
 Specialty Pharmacy Refill Coordination Note  Christian Reid is a 49 y.o. male contacted today regarding refills of specialty medication(s) Bictegravir-Emtricitab-Tenofov (Biktarvy )   Patient requested [name] at [facility] at [place] date: 01/22/24   Medication will be filled on: 01/22/24

## 2024-02-12 ENCOUNTER — Other Ambulatory Visit (HOSPITAL_COMMUNITY): Payer: Self-pay

## 2024-02-15 ENCOUNTER — Other Ambulatory Visit: Payer: Self-pay

## 2024-02-17 ENCOUNTER — Other Ambulatory Visit: Payer: Self-pay

## 2024-02-17 NOTE — Progress Notes (Signed)
 Specialty Pharmacy Refill Coordination Note  Christian Reid is a 49 y.o. male contacted today regarding refills of specialty medication(s) Bictegravir-Emtricitab-Tenofov (Biktarvy )   Patient requested [name] at [facility] at [place] date: 02/18/24   Medication will be filled on: 02/17/24

## 2024-02-22 ENCOUNTER — Other Ambulatory Visit (HOSPITAL_COMMUNITY): Payer: Self-pay

## 2024-02-22 ENCOUNTER — Other Ambulatory Visit: Payer: Self-pay

## 2024-03-23 ENCOUNTER — Ambulatory Visit: Admitting: Dermatology

## 2024-06-22 ENCOUNTER — Ambulatory Visit: Admitting: Infectious Disease
